# Patient Record
Sex: Male | Born: 1960
Health system: Southern US, Community
[De-identification: ages and names within clinical notes are randomized; demographics above are authoritative.]

## PROBLEM LIST (undated history)

## (undated) DIAGNOSIS — J449 Chronic obstructive pulmonary disease, unspecified: Secondary | ICD-10-CM

## (undated) DIAGNOSIS — J45909 Unspecified asthma, uncomplicated: Secondary | ICD-10-CM

## (undated) HISTORY — DX: Chronic obstructive pulmonary disease, unspecified: J44.9

## (undated) HISTORY — PX: EYE SURGERY: SHX253

## (undated) HISTORY — DX: Unspecified asthma, uncomplicated: J45.909

---

## 2013-06-20 ENCOUNTER — Ambulatory Visit: Payer: No Typology Code available for payment source | Attending: Internal Medicine | Admitting: Internal Medicine

## 2013-06-20 VITALS — BP 116/83 | HR 100 | Temp 98.7°F | Resp 16 | Wt 167.8 lb

## 2013-06-20 DIAGNOSIS — M549 Dorsalgia, unspecified: Secondary | ICD-10-CM

## 2013-06-20 DIAGNOSIS — J441 Chronic obstructive pulmonary disease with (acute) exacerbation: Secondary | ICD-10-CM | POA: Insufficient documentation

## 2013-06-20 DIAGNOSIS — F17200 Nicotine dependence, unspecified, uncomplicated: Secondary | ICD-10-CM

## 2013-06-20 MED ORDER — ALBUTEROL SULFATE (2.5 MG/3ML) 0.083% IN NEBU: 2.5000 mg | INHALATION_SOLUTION | Freq: Four times a day (QID) | RESPIRATORY_TRACT | Status: DC | PRN

## 2013-06-20 MED ORDER — PREDNISONE 10 MG PO TABS: ORAL_TABLET | ORAL | Status: DC

## 2013-06-20 MED ORDER — FLUTICASONE-SALMETEROL 100-50 MCG/DOSE IN AEPB: 1.0000 | INHALATION_SPRAY | Freq: Two times a day (BID) | RESPIRATORY_TRACT | Status: DC

## 2013-06-20 MED ORDER — METHYLPREDNISOLONE SODIUM SUCC 125 MG IJ SOLR
80.0000 mg | Freq: Once | INTRAMUSCULAR | Status: AC
  Administered 2013-06-20: 80 mg via INTRAMUSCULAR

## 2013-06-20 MED ORDER — IPRATROPIUM-ALBUTEROL 0.5-2.5 (3) MG/3ML IN SOLN
3.0000 mL | Freq: Once | RESPIRATORY_TRACT | Status: AC
  Administered 2013-06-20: 3 mL via RESPIRATORY_TRACT

## 2013-06-20 MED ORDER — AZITHROMYCIN 250 MG PO TABS: ORAL_TABLET | ORAL | Status: DC

## 2013-06-20 MED ORDER — ALBUTEROL SULFATE HFA 108 (90 BASE) MCG/ACT IN AERS: 2.0000 | INHALATION_SPRAY | RESPIRATORY_TRACT | Status: DC | PRN
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
## 2013-06-20 NOTE — Progress Notes (Signed)
Patient Demographics  Jordan Bradshaw, is a 52 y.o. male  WUJ:[id number]  NFA:[id number]  DOB - 11-15-1960  Chief Complaint  Patient presents with  . COPD        Subjective:   Jordan Bradshaw today is here to establish primary care. Patient is a 52 year old Caucasian male who has a long-standing history of COPD, ongoing tobacco use, chronic back pain who has not seen a physician for the past 3 years comes in for worsening shortness of breath and cough. Asian is originally from [place], has moved to this area approximately 2 years ago, claims that at baseline he has trouble with shortness of breath it is mostly exertional. He cannot go grocery shopping without stopping multiple times because of shortness of breath. He notes that for the past one year he thinks that this is gotten progressively worse. He has a very long history of smoking, now is down to approximately half a pack a day. He has a dry cough. He feels intermittently febrile.  He also has severe degenerative changes according to him in his back. Back pain is chronic and has been going on for past 3-4 years as well. He does not have any issues with urinary incontinence, fecal incontinence or any lower extremity weakness.  Currently patient has no complaints. Patient has also has No headache, No chest pain, No abdominal pain,No Nausea, No new weakness tingling or numbness  Objective:    Filed Vitals:   06/20/13 1140  BP: 116/83  Pulse: 100  Temp: 98.7 F (37.1 C)  Resp: 16  Weight: 167 lb 12.8 oz (76.114 kg)  SpO2: 98%     ALLERGIES:  No Known Allergies  PAST MEDICAL HISTORY: Past Medical History  Diagnosis Date  . Asthma   . COPD (chronic obstructive pulmonary disease)     PAST SURGICAL HISTORY: Past Surgical History  Procedure Laterality Date  . Eye surgery      FAMILY HISTORY: History reviewed. No pertinent family history.  MEDICATIONS AT HOME: Prior to Admission medications   Medication Sig Start  Date End Date Taking? Authorizing Provider  albuterol (PROVENTIL HFA;VENTOLIN HFA) 108 (90 BASE) MCG/ACT inhaler Inhale 2 puffs into the lungs every 4 (four) hours as needed for wheezing or shortness of breath. 06/20/13   Shanker Levora Dredge, MD  albuterol (PROVENTIL) (2.5 MG/3ML) 0.083% nebulizer solution Take 3 mLs (2.5 mg total) by nebulization every 6 (six) hours as needed for wheezing or shortness of breath. 06/20/13   Shanker Levora Dredge, MD  azithromycin (ZITHROMAX) 250 MG tablet Take 2 tablets daily for 3 days 06/20/13   Maretta Bees, MD  Fluticasone-Salmeterol (ADVAIR) 100-50 MCG/DOSE AEPB Inhale 1 puff into the lungs 2 (two) times daily. 06/20/13   Shanker Levora Dredge, MD  predniSONE (DELTASONE) 10 MG tablet Take 4 tablets (40 mg) daily for 2 days, then, Take 3 tablets (30 mg) daily for 2 days, then, Take 2 tablets (20 mg) daily for 2 days, then, Take1 tablets (10 mg) daily for 1 days, then stop 06/20/13   Maretta Bees, MD    SOCIAL HISTORY:   reports that he has been smoking Cigarettes.  He has been smoking about 0.00 packs per day. He does not have any smokeless tobacco history on file. He reports that he drinks alcohol. His drug history is not on file.  REVIEW OF SYSTEMS:  Constitutional:   No   Fevers, chills, fatigue.  HEENT:    No headaches, Sore throat,   Cardio-vascular: No chest  pain,  Orthopnea, swelling in lower extremities, anasarca, palpitations  GI:  No abdominal pain, nausea, vomiting, diarrhea  Resp:  No coughing up of blood  Skin:  no rash or lesions.  GU:  no dysuria, change in color of urine, no urgency or frequency.  No flank pain.  Musculoskeletal: No joint pain or swelling.  No decreased range of motion.  No back pain.  Psych: No change in mood or affect. No depression or anxiety.  No memory loss.   Exam  General appearance :Awake, alert, not in any distress. Speech Clear. Not toxic Looking HEENT: Atraumatic and Normocephalic, pupils  equally reactive to light and accomodation Neck: supple, no JVD. No cervical lymphadenopathy.  Chest:Good air entry bilaterally, coarse expiratory rhonchi heard.  CVS: S1 S2 regular, no murmurs.  Abdomen: Bowel sounds present, Non tender and not distended with no gaurding, rigidity or rebound. Extremities: B/L Lower Ext shows no edema, both legs are warm to touch Neurology: Awake alert, and oriented X 3, CN II-XII intact, Non focal Skin:No Rash Wounds:N/A    Data Review   CBC No results found for this basename: WBC, HGB, HCT, PLT, MCV, MCH, MCHC, RDW, NEUTRABS, LYMPHSABS, MONOABS, EOSABS, BASOSABS, BANDABS, BANDSABD,  in the last 168 hours  Chemistries   No results found for this basename: NA, K, CL, CO2, GLUCOSE, BUN, CREATININE, GFRCGP, CALCIUM, MG, AST, ALT, ALKPHOS, BILITOT,  in the last 168 hours ------------------------------------------------------------------------------------------------------------------ No results found for this basename: HGBA1C,  in the last 72 hours ------------------------------------------------------------------------------------------------------------------ No results found for this basename: CHOL, HDL, LDLCALC, TRIG, CHOLHDL, LDLDIRECT,  in the last 72 hours ------------------------------------------------------------------------------------------------------------------ No results found for this basename: TSH, T4TOTAL, FREET3, T3FREE, THYROIDAB,  in the last 72 hours ------------------------------------------------------------------------------------------------------------------ No results found for this basename: VITAMINB12, FOLATE, FERRITIN, TIBC, IRON, RETICCTPCT,  in the last 72 hours  Coagulation profile  No results found for this basename: INR, PROTIME,  in the last 168 hours    Assessment & Plan   COPD with mild exacerbation - Will give him on treatment with DuoNeb here in the office along with an intramuscular shot of Solu-Medrol -  Wouldn't put him on a prednisone taper and albuterol rescue inhaler. He has had urinary retention multiple times with Spiriva and would not like to be placed on Spiriva again, as a result I will place him on Advair. - We will see him in 2 weeks for a followup visit, if he were to have worsening of his symptoms, I recommended him to proceed to the emergency room.  Tobacco use - Counseled strongly to quit-I've explained that he needs to have the desire to quit-he has agreed to think about it and we will reevaluate all options next visit.  Chronic back pain - This is a chronic issue that has been going on for 3-4 years, we will start by getting an x-ray  I will order for routine blood work to be done a few days prior to his next appointment. Please follow his lab work at next visit.  Follow up in 2 weeks  The patient was given clear instructions to go to ER or return to medical center if symptoms don't improve, worsen or new problems develop. The patient verbalized understanding. The patient was told to call to get lab results if they haven't heard anything in the next week.

## 2013-06-20 NOTE — Addendum Note (Signed)
Addended by: Maretta Bees on: 06/20/2013 01:51 PM   Modules accepted: Orders

## 2013-06-26 ENCOUNTER — Encounter: Payer: Self-pay | Admitting: Internal Medicine

## 2013-06-26 ENCOUNTER — Ambulatory Visit (INDEPENDENT_AMBULATORY_CARE_PROVIDER_SITE_OTHER): Payer: No Typology Code available for payment source | Admitting: Internal Medicine

## 2013-06-26 VITALS — BP 102/70 | HR 98 | Temp 98.1°F | Ht 72.0 in | Wt 176.0 lb

## 2013-06-26 DIAGNOSIS — J449 Chronic obstructive pulmonary disease, unspecified: Secondary | ICD-10-CM

## 2013-06-26 DIAGNOSIS — F17200 Nicotine dependence, unspecified, uncomplicated: Secondary | ICD-10-CM

## 2013-06-26 MED ORDER — VARENICLINE TARTRATE 0.5 MG X 11 & 1 MG X 42 PO MISC: ORAL | Status: DC

## 2013-06-26 NOTE — Patient Instructions (Addendum)
Continue advair one twice daily perfectly regularly and taper off prednisone as planned  Only use your albuterol (ventolin) as a rescue medication to be used if you can't catch your breath by resting or doing a relaxed purse lip breathing pattern.  - The less you use it, the better it will work when you need it. - Ok to use up to every 4 hours if you must but call for immediate appointment if use goes up over your usual need - Don't leave home without it !!  (think of it like your spare tire for your car)   - Nebulizer is a backup for your ventolin, only use it after you first use your ventolin.   The key is to stop smoking completely before smoking completely stops you!   chantix starter pack use as directed   Please schedule a follow up office visit in 2 weeks, sooner if needed.with cxr on return and all inhalers in hand

## 2013-06-26 NOTE — Progress Notes (Signed)
  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 Better on advair, assoc with Heart burn.   y on advair 100 one bid. Still uses 3-4 saba hfa per day and also neb at bedtime. Some assoc HB but no def connection to flares of sob/cough   No obvious pattern in day to day or daytime variabilty or assoc  cp or chest tightness, subjective wheeze overt sinus   symptoms. No unusual exp hx or h/o childhood pna/ asthma or knowledge of premature birth.    Also denies any obvious fluctuation of symptoms with weather or environmental changes or other aggravating or alleviating factors except as outlined above   Current Medications, Allergies, Complete Past Medical History, Past Surgical History, Family History, and Social History were reviewed in [employer] record.         Review of Systems  Constitutional: Positive for appetite change. Negative for fever, chills, activity change and unexpected weight change.  HENT: Positive for dental problem. Negative for congestion, postnasal drip, rhinorrhea, sneezing, sore throat, trouble swallowing and voice change.   Eyes: Negative for visual disturbance.  Respiratory: Positive for cough and shortness of breath. Negative for choking.   Cardiovascular: Negative for chest pain and leg swelling.  Gastrointestinal: Negative for nausea, vomiting and abdominal pain.  Genitourinary: Negative for difficulty  urinating.       Acid Heartburn Indigestion  Musculoskeletal: Positive for arthralgias.  Skin: Positive for rash.  Psychiatric/Behavioral: Negative for behavioral problems and confusion.       Objective:   Physical Exam  Wt Readings from Last 3 Encounters:  06/26/13 176 lb (79.833 kg)  06/20/13 167 lb 12.8 oz (76.114 kg)     amb wm nad  HEENT mild turbinate edema.  Oropharynx no thrush or excess pnd or cobblestoning.  No JVD or cervical adenopathy. Mild accessory muscle hypertrophy. Trachea midline, nl thryroid. Chest was hyperinflated by percussion with diminished breath sounds and moderate increased exp time without wheeze. Hoover sign positive at mid inspiration. Regular rate and rhythm without murmur gallop or rub or increase P2 or edema.  Abd: no hsm, nl excursion. Ext warm without cyanosis or clubbing.         Assessment & Plan:

## 2013-06-28 DIAGNOSIS — J449 Chronic obstructive pulmonary disease, unspecified: Secondary | ICD-10-CM | POA: Insufficient documentation

## 2013-06-28 DIAGNOSIS — F17200 Nicotine dependence, unspecified, uncomplicated: Secondary | ICD-10-CM | POA: Insufficient documentation

## 2013-06-28 HISTORY — DX: Chronic obstructive pulmonary disease, unspecified: J44.9

## 2013-06-28 NOTE — Assessment & Plan Note (Signed)
He feels advair is already helping (though may be the prednisone taper) so would like to see him back for baseline spirometry at which point he should be off the advair  My main discussion related to smoking (see sep a/p) - without a commitment from him on this there is very little I can do for him as he has already proven intolerance to [facility] and has very limited resources for meds

## 2013-06-28 NOTE — Assessment & Plan Note (Signed)

## 2013-07-06 ENCOUNTER — Other Ambulatory Visit: Payer: Self-pay | Admitting: Internal Medicine

## 2013-07-06 MED ORDER — FLUTICASONE-SALMETEROL 100-50 MCG/DOSE IN AEPB: 1.0000 | INHALATION_SPRAY | Freq: Two times a day (BID) | RESPIRATORY_TRACT | Status: DC

## 2013-07-10 ENCOUNTER — Ambulatory Visit: Payer: Self-pay

## 2013-07-12 ENCOUNTER — Ambulatory Visit: Payer: No Typology Code available for payment source | Admitting: Internal Medicine

## 2013-07-24 ENCOUNTER — Ambulatory Visit (INDEPENDENT_AMBULATORY_CARE_PROVIDER_SITE_OTHER)
Admission: RE | Admit: 2013-07-24 | Discharge: 2013-07-24 | Disposition: A | Discharge status: Home / Self Care | Payer: No Typology Code available for payment source | Source: Ambulatory Visit | Attending: Internal Medicine | Admitting: Internal Medicine

## 2013-07-24 ENCOUNTER — Ambulatory Visit (INDEPENDENT_AMBULATORY_CARE_PROVIDER_SITE_OTHER): Payer: No Typology Code available for payment source | Admitting: Internal Medicine

## 2013-07-24 ENCOUNTER — Encounter: Payer: Self-pay | Admitting: Internal Medicine

## 2013-07-24 DIAGNOSIS — J449 Chronic obstructive pulmonary disease, unspecified: Secondary | ICD-10-CM

## 2013-07-24 NOTE — Progress Notes (Signed)
Quick Note:  Spoke with pt and notified of results per Dr. Wert. Pt verbalized understanding and denied any questions.  ______ 

## 2013-07-24 NOTE — Progress Notes (Signed)
  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 Better on advair, assoc with Heart burn.   y on advair 100 one bid. Still uses 3-4 saba hfa per day and also neb at bedtime. Some assoc HB but no def connection to flares of sob/cough  rec Continue advair one twice daily perfectly regularly and taper off prednisone as planned Only use your albuterol (ventolin)   Nebulizer is a backup for your ventolin, only use it after you first use your ventolin.  The key is to stop smoking completely before smoking completely stops you!  chantix starter pack use as directed   07/24/2013 f/u ov/Adit Riddles re: ? Copd Went for cxr and did not return        Objective:   Physical Exam  Wt Readings from Last 3 Encounters:  06/26/13 176 lb (79.833 kg)  06/20/13 167 lb 12.8 oz (76.114 kg)            CXR  07/24/2013 :   Mild hyperinflation is compatible with history of asthma. No focal chest disease.      Assessment & Plan:

## 2013-07-24 NOTE — Assessment & Plan Note (Signed)
Did not return p cxr > needs f/u ov with pfts

## 2013-07-24 NOTE — Patient Instructions (Addendum)
F/u with pfts

## 2013-07-27 ENCOUNTER — Other Ambulatory Visit: Payer: Self-pay | Admitting: Internal Medicine

## 2013-07-27 DIAGNOSIS — J449 Chronic obstructive pulmonary disease, unspecified: Secondary | ICD-10-CM

## 2013-07-31 ENCOUNTER — Encounter: Payer: Self-pay | Admitting: Internal Medicine

## 2013-07-31 ENCOUNTER — Ambulatory Visit: Payer: No Typology Code available for payment source | Attending: Internal Medicine | Admitting: Internal Medicine

## 2013-07-31 ENCOUNTER — Ambulatory Visit (INDEPENDENT_AMBULATORY_CARE_PROVIDER_SITE_OTHER): Payer: No Typology Code available for payment source | Admitting: Internal Medicine

## 2013-07-31 VITALS — BP 121/90 | HR 116 | Temp 98.8°F | Resp 14 | Ht 74.0 in | Wt 180.2 lb

## 2013-07-31 VITALS — BP 90/60 | HR 104 | Temp 98.0°F | Ht 72.5 in | Wt 180.0 lb

## 2013-07-31 DIAGNOSIS — J449 Chronic obstructive pulmonary disease, unspecified: Secondary | ICD-10-CM

## 2013-07-31 DIAGNOSIS — F17200 Nicotine dependence, unspecified, uncomplicated: Secondary | ICD-10-CM

## 2013-07-31 DIAGNOSIS — G8929 Other chronic pain: Secondary | ICD-10-CM

## 2013-07-31 LAB — PULMONARY FUNCTION TEST
DL/VA % pred: 80 %
DLCO unc % pred: 69 %
DLCO unc: 24.82 ml/min/mmHg
FEF 25-75 Post: 1.61 L/sec
FEF2575-%Pred-Pre: 42 %
FEV1-%Pred-Post: 70 %
FEV1-%Pred-Pre: 69 %
FEV1-Post: 2.97 L
FEV1-Pre: 2.94 L
FEV1FVC-%Change-Post: 0 %
FEV1FVC-%Pred-Pre: 78 %
FEV6-%Change-Post: 0 %
FEV6-%Pred-Post: 88 %
FEV6-%Pred-Pre: 88 %
FEV6-Post: 4.7 L
FEV6-Pre: 4.69 L
FEV6FVC-%Change-Post: 0 %
FEV6FVC-%Pred-Post: 101 %
FVC-%Change-Post: 0 %
FVC-%Pred-Post: 87 %
FVC-%Pred-Pre: 87 %
FVC-Post: 4.82 L
FVC-Pre: 4.78 L
Post FEV6/FVC ratio: 98 %
Pre FEV1/FVC ratio: 61 %
Pre FEV6/FVC Ratio: 98 %

## 2013-07-31 LAB — COMPREHENSIVE METABOLIC PANEL
ALT: 40 U/L (ref 0–53)
AST: 121 U/L — ABNORMAL HIGH (ref 0–37)
Albumin: 3.8 g/dL (ref 3.5–5.2)
Alkaline Phosphatase: 97 U/L (ref 39–117)
BUN: 3 mg/dL — ABNORMAL LOW (ref 6–23)
Glucose, Bld: 55 mg/dL — ABNORMAL LOW (ref 70–99)
Potassium: 4.1 mEq/L (ref 3.5–5.3)
Sodium: 140 mEq/L (ref 135–145)
Total Bilirubin: 0.8 mg/dL (ref 0.3–1.2)

## 2013-07-31 LAB — CBC WITH DIFFERENTIAL/PLATELET
Basophils Absolute: 0.1 10*3/uL (ref 0.0–0.1)
Basophils Relative: 1 % (ref 0–1)
Eosinophils Absolute: 0.1 10*3/uL (ref 0.0–0.7)
HCT: 42.4 % (ref 39.0–52.0)
MCH: 34.9 pg — ABNORMAL HIGH (ref 26.0–34.0)
MCHC: 35.6 g/dL (ref 30.0–36.0)
Monocytes Absolute: 0.6 10*3/uL (ref 0.1–1.0)
Neutro Abs: 1.8 10*3/uL (ref 1.7–7.7)
Neutrophils Relative %: 33 % — ABNORMAL LOW (ref 43–77)
RDW: 13.9 % (ref 11.5–15.5)
WBC: 5.4 10*3/uL (ref 4.0–10.5)

## 2013-07-31 MED ORDER — CYCLOBENZAPRINE HCL 10 MG PO TABS: 10.0000 mg | ORAL_TABLET | Freq: Three times a day (TID) | ORAL | Status: DC

## 2013-07-31 MED ORDER — MELOXICAM 15 MG PO TABS: 15.0000 mg | ORAL_TABLET | Freq: Every day | ORAL | Status: DC

## 2013-07-31 MED ORDER — TRAMADOL HCL 50 MG PO TABS: 50.0000 mg | ORAL_TABLET | Freq: Four times a day (QID) | ORAL | Status: DC | PRN

## 2013-07-31 MED ORDER — FLUTICASONE-SALMETEROL 250-50 MCG/DOSE IN AEPB: 1.0000 | INHALATION_SPRAY | Freq: Two times a day (BID) | RESPIRATORY_TRACT | Status: DC

## 2013-07-31 MED ORDER — TAMSULOSIN HCL 0.4 MG PO CAPS: 0.4000 mg | ORAL_CAPSULE | Freq: Every day | ORAL | Status: DC

## 2013-07-31 MED ORDER — PREDNISONE (PAK) 10 MG PO TABS: ORAL_TABLET | Freq: Every day | ORAL | Status: DC

## 2013-07-31 MED ORDER — IBUPROFEN 600 MG PO TABS: 600.0000 mg | ORAL_TABLET | Freq: Three times a day (TID) | ORAL | Status: DC | PRN

## 2013-07-31 NOTE — Progress Notes (Unsigned)
Patient ID: Jordan Bradshaw, male   DOB: 11-01-1960, 52 y.o.   MRN: [id number] Patient Demographics  Jordan Bradshaw, is a 52 y.o. male  WUJ:[id number]  NFA:[id number]  DOB - Sep 12, 1960  Chief Complaint  Patient presents with  . office visit        Subjective:   Jordan Bradshaw today is here for a follow up visit. The patient is a 52 year old male with history of COPD, following pulmonology, states he feels better with COPD, he also stopped taking his Advair states caused urinary retention. He also reports urinary retention, increased frequency, urgency, dribbling, worse at night. The patient also tearful and states he is miserable with his back pain, used to work as a Nutritional therapist. He states that he has been in back pain for last 3 years, his physician in [place] had obtained a CT scans of his spine 3 years ago and told him that he has disc disease. He also reports that he has neck pain and radiating to the right shoulder and right arm, low back pain radiating down to the right lower extremity.    Patient has No headache, No chest pain, No abdominal pain - No Nausea,   Objective:    Filed Vitals:   07/31/13 1146  BP: 121/90  Pulse: 116  Temp: 98.8 F (37.1 C)  TempSrc: Oral  Resp: 14  Height: 6\' 2"  (1.88 m)  Weight: 180 lb 3.2 oz (81.738 kg)  SpO2: 100%     ALLERGIES:  No Known Allergies  PAST MEDICAL HISTORY: Past Medical History  Diagnosis Date  . Asthma   . COPD (chronic obstructive pulmonary disease)     MEDICATIONS AT HOME: Prior to Admission medications   Medication Sig Start Date End Date Taking? Authorizing Provider  albuterol (PROVENTIL HFA;VENTOLIN HFA) 108 (90 BASE) MCG/ACT inhaler Inhale 2 puffs into the lungs every 4 (four) hours as needed for wheezing or shortness of breath. 06/20/13  Yes Shanker Levora Dredge, MD  albuterol (PROVENTIL) (2.5 MG/3ML) 0.083% nebulizer solution Take 3 mLs (2.5 mg total) by nebulization every 6 (six) hours as needed for wheezing or  shortness of breath. 06/20/13  Yes Shanker Levora Dredge, MD  cyclobenzaprine (FLEXERIL) 10 MG tablet Take 1 tablet (10 mg total) by mouth 3 (three) times daily. 07/31/13   Ripudeep Jenna Luo, MD  Fluticasone-Salmeterol (ADVAIR) 100-50 MCG/DOSE AEPB Inhale 1 puff into the lungs 2 (two) times daily. 07/06/13   Jeanann Lewandowsky, MD  ibuprofen (ADVIL,MOTRIN) 600 MG tablet Take 1 tablet (600 mg total) by mouth every 8 (eight) hours as needed for moderate pain. 07/31/13   Ripudeep Jenna Luo, MD  meloxicam (MOBIC) 15 MG tablet Take 1 tablet (15 mg total) by mouth daily. 07/31/13   Ripudeep Jenna Luo, MD  predniSONE (STERAPRED UNI-PAK) 10 MG tablet Take by mouth daily. Take as directed on the pack 07/31/13   Ripudeep K Rai, MD  tamsulosin (FLOMAX) 0.4 MG CAPS capsule Take 1 capsule (0.4 mg total) by mouth daily. 07/31/13   Ripudeep Jenna Luo, MD  traMADol (ULTRAM) 50 MG tablet Take 1 tablet (50 mg total) by mouth every 6 (six) hours as needed for moderate pain. 07/31/13   Ripudeep Jenna Luo, MD  varenicline (CHANTIX PAK) 0.5 MG X 11 & 1 MG X 42 tablet Take one 0.5 mg tablet by mouth once daily for 3 days, then increase to one 0.5 mg tablet twice daily for 4 days, then increase to one 1 mg tablet twice daily. 06/26/13   Charlaine Dalton  Sherene Sires, MD     Exam  General appearance :Awake, alert, NAD, Speech Clear.  HEENT: Atraumatic and Normocephalic, PERLA Neck: supple, no JVD. No cervical lymphadenopathy.  Chest: Clear to auscultation bilaterally, no wheezing, rales or rhonchi CVS: S1 S2 regular, no murmurs.  Abdomen: soft, NBS, NT, ND, no gaurding, rigidity or rebound. Extremities: no cyanosis or clubbing, B/L Lower Ext shows no edema Neurology: Awake alert, and oriented X 3, CN II-XII intact, Non focal Back : Patient did have cervical spine tenderness, T11-12, entire lumbar spine.   Skin: No Rash or lesions Wounds:N/A    Data Review   Basic Metabolic Panel: No results found for this basename: NA, K, CL, CO2, GLUCOSE, BUN,  CREATININE, CALCIUM, MG, PHOS,  in the last 168 hours Liver Function Tests: No results found for this basename: AST, ALT, ALKPHOS, BILITOT, PROT, ALBUMIN,  in the last 168 hours  CBC: No results found for this basename: WBC, NEUTROABS, HGB, HCT, MCV, PLT,  in the last 168 hours  ------------------------------------------------------------------------------------------------------------------ No results found for this basename: HGBA1C,  in the last 72 hours ------------------------------------------------------------------------------------------------------------------ No results found for this basename: CHOL, HDL, LDLCALC, TRIG, CHOLHDL, LDLDIRECT,  in the last 72 hours ------------------------------------------------------------------------------------------------------------------ No results found for this basename: TSH, T4TOTAL, FREET3, T3FREE, THYROIDAB,  in the last 72 hours ------------------------------------------------------------------------------------------------------------------ No results found for this basename: VITAMINB12, FOLATE, FERRITIN, TIBC, IRON, RETICCTPCT,  in the last 72 hours  Coagulation profile  No results found for this basename: INR, PROTIME,  in the last 168 hours    Assessment & Plan   Active Problems: Patient Active Problem List   Diagnosis Date Noted  . Chronic back pain, acute on chronic  -  obtain MRI of the cervical, thoracic, lumbar spine. Given patient has numbness and tingling and neuropathic symptoms, will expedite care by obtaining MRI, he probably will need to be seen by orthospine or neurosurgery soon. First await MRI results. - Started patient on Mobic 15 mg daily, Sterapred pack, Flexeril, and tramadol ibuprofen PRN - Will see within 4 weeks after MRI is done  07/31/2013  . COPD  - Currently stable, defer to pulmonology   06/28/2013  . Smoker - Patient counseled on smoking cessation  06/28/2013    Recommendations: Came baseline  labs, CBC, CMET, Vit D level Follow-up in 4 weeks     RAI,RIPUDEEP M.D. 07/31/2013, 12:09 PM

## 2013-07-31 NOTE — Progress Notes (Signed)
PFT done today. 

## 2013-07-31 NOTE — Patient Instructions (Signed)
Increase the advair to 250 one puff twice daily   The key is to stop smoking completely before smoking completely stops you - it's definitely not too late !

## 2013-07-31 NOTE — Progress Notes (Signed)
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 Better on advair, assoc with Heart burn.    on advair 100 one bid. Still uses 3-4 saba hfa per day and also neb at bedtime. Some assoc HB but no def connection to flares of sob/cough  rec Continue advair one twice daily perfectly regularly and taper off prednisone as planned Only use your albuterol (ventolin) prn   Nebulizer is a backup for your ventolin, only use it after you first use your ventolin.  The key is to stop smoking completely before smoking completely stops you!  chantix starter pack use as directed   07/24/2013 f/u ov/Jordan Bradshaw re: ? Copd Went for cxr and did not return  07/31/2013 f/u ov/Jordan Bradshaw re:  Chief Complaint  Patient presents with  . Followup with PFT    Pt states his breathing is doing better since last appt. No new co's todayt.   no using saba much at all/ never in neb form now, on advair 100/50 bid  No obvious day to day or daytime variabilty or assoc chronic cough or cp or chest tightness, subjective wheeze overt sinus or hb symptoms. No unusual exp hx or h/o childhood pna/ asthma or knowledge of premature birth.  Sleeping ok without nocturnal  or early am exacerbation  of respiratory  c/o's or need for noct saba. Also denies any obvious fluctuation of symptoms with weather or environmental changes or other aggravating or alleviating  factors except as outlined above   Current Medications, Allergies, Complete Past Medical History, Past Surgical History, Family History, and Social History were reviewed in [employer] record.  ROS  The following are not active complaints unless bolded sore throat, dysphagia, dental problems, itching, sneezing,  nasal congestion or excess/ purulent secretions, ear ache,   fever, chills, sweats, unintended wt loss, pleuritic or exertional cp, hemoptysis,  orthopnea pnd or leg swelling, presyncope, palpitations, heartburn, abdominal pain, anorexia, nausea, vomiting, diarrhea  or change in bowel or urinary habits, change in stools or urine, dysuria,hematuria,  rash, arthralgias, visual complaints, headache, numbness weakness or ataxia or problems with walking or coordination,  change in mood/affect or memory.              Objective:   Physical Exam Wt Readings from Last 3 Encounters:  07/31/13 180 lb (81.647 kg)  07/31/13 180 lb 3.2 oz (81.738 kg)  06/26/13 176 lb (79.833 kg)          Depressed / withdrawn/ sits slumped over and grunts some responses   HEENT mild turbinate edema.  Oropharynx no thrush or excess pnd or cobblestoning.  No JVD or cervical adenopathy. Mild accessory muscle hypertrophy. Trachea midline, nl thryroid. Chest was hyperinflated by percussion with diminished breath sounds and moderate increased exp time without wheeze. Hoover sign positive at mid inspiration. Regular rate  and rhythm without murmur gallop or rub or increase P2 or edema.  Abd: no hsm, nl excursion. Ext warm without cyanosis or clubbing.     CXR  07/24/2013 :   Mild hyperinflation is compatible with history of asthma. No focal chest disease.      Assessment & Plan:

## 2013-08-01 ENCOUNTER — Encounter: Payer: Self-pay | Admitting: Internal Medicine

## 2013-08-01 NOTE — Assessment & Plan Note (Signed)
See copd/ key is to stop smoking now while FEV1 is still well preserved

## 2013-08-01 NOTE — Assessment & Plan Note (Addendum)
-   hfa 75% 06/26/13  - PFTs 07/31/2013  FEV1 2.94 (69%) ratio 61 and no better p B2   DLCO 69 corrects to 80  I had an extended discussion with the patient today lasting 15 to 20 minutes of a 25 minute visit on the following issues:  I reviewed the Flethcher curve with patient that basically indicates  if you quit smoking when your best day FEV1 is still well preserved it is highly unlikely you will progress to severe disease and informed the patient there was no medication on the market that has proven to change the curve or the likelihood of progression.  Therefore stopping smoking and maintaining abstinence is the most important aspect of care, not choice of inhalers or for that matter, doctors.    Best option is advair 250/50 one bid and prn saba  Pulmonary f/u can be prn

## 2013-08-03 ENCOUNTER — Ambulatory Visit (HOSPITAL_COMMUNITY)
Admission: RE | Admit: 2013-08-03 | Discharge: 2013-08-03 | Disposition: A | Discharge status: Home / Self Care | Payer: No Typology Code available for payment source | Source: Ambulatory Visit | Attending: Internal Medicine | Admitting: Internal Medicine

## 2013-08-03 DIAGNOSIS — M8448XA Pathological fracture, other site, initial encounter for fracture: Secondary | ICD-10-CM | POA: Insufficient documentation

## 2013-08-03 DIAGNOSIS — M48061 Spinal stenosis, lumbar region without neurogenic claudication: Secondary | ICD-10-CM | POA: Insufficient documentation

## 2013-08-03 DIAGNOSIS — M5144 Schmorl's nodes, thoracic region: Secondary | ICD-10-CM | POA: Insufficient documentation

## 2013-08-03 DIAGNOSIS — M47814 Spondylosis without myelopathy or radiculopathy, thoracic region: Secondary | ICD-10-CM | POA: Insufficient documentation

## 2013-08-03 DIAGNOSIS — G8929 Other chronic pain: Secondary | ICD-10-CM | POA: Insufficient documentation

## 2013-08-03 DIAGNOSIS — M47812 Spondylosis without myelopathy or radiculopathy, cervical region: Secondary | ICD-10-CM | POA: Insufficient documentation

## 2013-08-03 DIAGNOSIS — M5030 Other cervical disc degeneration, unspecified cervical region: Secondary | ICD-10-CM | POA: Insufficient documentation

## 2013-08-31 ENCOUNTER — Ambulatory Visit: Payer: No Typology Code available for payment source | Attending: Internal Medicine | Admitting: Internal Medicine

## 2013-08-31 ENCOUNTER — Encounter: Payer: Self-pay | Admitting: Internal Medicine

## 2013-08-31 VITALS — BP 146/86 | HR 80 | Temp 98.2°F | Resp 15 | Wt 185.0 lb

## 2013-08-31 DIAGNOSIS — M4802 Spinal stenosis, cervical region: Secondary | ICD-10-CM

## 2013-08-31 DIAGNOSIS — M549 Dorsalgia, unspecified: Secondary | ICD-10-CM

## 2013-08-31 DIAGNOSIS — G8929 Other chronic pain: Secondary | ICD-10-CM

## 2013-08-31 DIAGNOSIS — M48061 Spinal stenosis, lumbar region without neurogenic claudication: Secondary | ICD-10-CM

## 2013-08-31 MED ORDER — TRAMADOL HCL 50 MG PO TABS: 50.0000 mg | ORAL_TABLET | Freq: Four times a day (QID) | ORAL | Status: DC | PRN

## 2013-08-31 NOTE — Progress Notes (Signed)
Patient here for follow up on his back pain Has history of slipped disk and degenerative disk disease

## 2013-08-31 NOTE — Progress Notes (Signed)
MRN: [id number] Name: Jordan Bradshaw  Sex: male Age: 53 y.o. DOB: Dec 29, 1960  Allergies: Review of patient's allergies indicates no known allergies.  Chief Complaint  Patient presents with  . Back Pain    HPI: Patient is 53 y.o. male who comes today for followup her history of chronic low back pain neck pain, was seen last month by Dr. Isidoro Donning and had a CAT scan spine done which reported to havecMild to moderate right foraminal stenosis at C2-3, C3-4, and  C4-5.  Mild central canal stenosis at C5-6 is worse on the right due to  a disc osteophyte complex. And lower lumbar spine CT scan reported Moderate to severe right foraminal stenosis due to asymmetric  right-sided endplate and facet changes. As per patient he has this pain for the last 3 years denies any fall or trauma the pain is worse on the right radial down to the leg denies any incontinence, patient is taking pain medication and is requesting refill on the medication. Patient also history of COPD following up with pulmonologist and is using his inhalers.    Past Medical History  Diagnosis Date  . Asthma   . COPD (chronic obstructive pulmonary disease)     Past Surgical History  Procedure Laterality Date  . Eye surgery        Medication List       This list is accurate as of: 08/31/13 11:25 AM.  Always use your most recent med list.               albuterol (2.5 MG/3ML) 0.083% nebulizer solution  Commonly known as:  PROVENTIL  Take 3 mLs (2.5 mg total) by nebulization every 6 (six) hours as needed for wheezing or shortness of breath.     albuterol 108 (90 BASE) MCG/ACT inhaler  Commonly known as:  PROVENTIL HFA;VENTOLIN HFA  Inhale 2 puffs into the lungs every 4 (four) hours as needed for wheezing or shortness of breath.     cyclobenzaprine 10 MG tablet  Commonly known as:  FLEXERIL  Take 1 tablet (10 mg total) by mouth 3 (three) times daily.     Fluticasone-Salmeterol 250-50 MCG/DOSE Aepb  Commonly known as:   ADVAIR  Inhale 1 puff into the lungs every 12 (twelve) hours.     ibuprofen 600 MG tablet  Commonly known as:  ADVIL,MOTRIN  Take 1 tablet (600 mg total) by mouth every 8 (eight) hours as needed for moderate pain.     meloxicam 15 MG tablet  Commonly known as:  MOBIC  Take 1 tablet (15 mg total) by mouth daily.     tamsulosin 0.4 MG Caps capsule  Commonly known as:  FLOMAX  Take 1 capsule (0.4 mg total) by mouth daily.     traMADol 50 MG tablet  Commonly known as:  ULTRAM  Take 1 tablet (50 mg total) by mouth every 6 (six) hours as needed for moderate pain.        Meds ordered this encounter  Medications  . traMADol (ULTRAM) 50 MG tablet    Sig: Take 1 tablet (50 mg total) by mouth every 6 (six) hours as needed for moderate pain.    Dispense:  60 tablet    Refill:  1     There is no immunization history on file for this patient.  Family History  Problem Relation Age of Onset  . Lung cancer Sister     smoked  . Ovarian cancer Mother     History  Substance Use Topics  . Smoking status: Current Some Day Smoker -- 0.50 packs/day for 35 years    Types: Cigarettes  . Smokeless tobacco: Not on file  . Alcohol Use: No    Review of Systems  As noted in HPI  Filed Vitals:   08/31/13 1050  BP: 146/86  Pulse: 80  Temp: 98.2 F (36.8 C)  Resp: 15    Physical Exam  Physical Exam  Constitutional:  Patient is complaining of lower back pain  Eyes: EOM are normal. Pupils are equal, round, and reactive to light.  Cardiovascular: Normal rate and regular rhythm.   Pulmonary/Chest: He has no rales.  Minimal wheezing  Musculoskeletal:  Upper cervical spine and lower cervical spine and paraspinal tenderness, patient could not do SLR test because of pain, DTR 1+    CBC    Component Value Date/Time   WBC 5.4 07/31/2013 1229   RBC 4.33 07/31/2013 1229   HGB 15.1 07/31/2013 1229   HCT 42.4 07/31/2013 1229   PLT 250 07/31/2013 1229   MCV 97.9 07/31/2013 1229    LYMPHSABS 2.8 07/31/2013 1229   MONOABS 0.6 07/31/2013 1229   EOSABS 0.1 07/31/2013 1229   BASOSABS 0.1 07/31/2013 1229    CMP     Component Value Date/Time   NA 140 07/31/2013 1229   K 4.1 07/31/2013 1229   CL 107 07/31/2013 1229   CO2 25 07/31/2013 1229   GLUCOSE 55* 07/31/2013 1229   BUN 3* 07/31/2013 1229   CREATININE 0.78 07/31/2013 1229   CALCIUM 9.3 07/31/2013 1229   PROT 6.5 07/31/2013 1229   ALBUMIN 3.8 07/31/2013 1229   AST 121* 07/31/2013 1229   ALT 40 07/31/2013 1229   ALKPHOS 97 07/31/2013 1229   BILITOT 0.8 07/31/2013 1229    No results found for this basename: chol, tri, ldl    No components found with this basename: hga1c    Lab Results  Component Value Date/Time   AST 121* 07/31/2013 12:29 PM    Assessment and Plan  Chronic back pain - Plan: Ambulatory referral to Neurosurgery, traMADol (ULTRAM) 50 MG tablet  Foraminal stenosis of lumbar region - Plan: Ambulatory referral to Neurosurgery  Cervical stenosis of spinal canal - Plan: Ambulatory referral to Neurosurgery  I have continued and given refill on the pain medication tramadol, patient referred to neurosurgery for further evaluation and management. Patient to continue follow with his pulmonologist.  Return in about 3 months (around 11/29/2013).  Doris CheadleADVANI, Malory Spurr, MD  80

## 2013-09-13 ENCOUNTER — Other Ambulatory Visit: Payer: Self-pay | Admitting: Internal Medicine

## 2013-09-13 MED ORDER — FLUTICASONE-SALMETEROL 250-50 MCG/DOSE IN AEPB: 1.0000 | INHALATION_SPRAY | Freq: Two times a day (BID) | RESPIRATORY_TRACT | Status: DC

## 2013-09-13 MED ORDER — ALBUTEROL SULFATE HFA 108 (90 BASE) MCG/ACT IN AERS: 2.0000 | INHALATION_SPRAY | RESPIRATORY_TRACT | Status: DC | PRN

## 2013-09-21 ENCOUNTER — Ambulatory Visit: Payer: No Typology Code available for payment source

## 2013-10-10 ENCOUNTER — Ambulatory Visit: Payer: No Typology Code available for payment source

## 2013-10-24 ENCOUNTER — Other Ambulatory Visit: Payer: Self-pay | Admitting: Emergency Medicine

## 2013-10-24 MED ORDER — ALBUTEROL SULFATE HFA 108 (90 BASE) MCG/ACT IN AERS: 2.0000 | INHALATION_SPRAY | RESPIRATORY_TRACT | Status: DC | PRN

## 2013-10-27 ENCOUNTER — Ambulatory Visit: Payer: No Typology Code available for payment source | Attending: Internal Medicine | Admitting: Internal Medicine

## 2013-10-27 VITALS — BP 128/86 | HR 100 | Temp 97.7°F | Resp 18 | Ht 72.0 in | Wt 194.0 lb

## 2013-10-27 DIAGNOSIS — M48061 Spinal stenosis, lumbar region without neurogenic claudication: Secondary | ICD-10-CM | POA: Insufficient documentation

## 2013-10-27 DIAGNOSIS — M4802 Spinal stenosis, cervical region: Secondary | ICD-10-CM | POA: Insufficient documentation

## 2013-10-27 DIAGNOSIS — J449 Chronic obstructive pulmonary disease, unspecified: Secondary | ICD-10-CM | POA: Insufficient documentation

## 2013-10-27 DIAGNOSIS — F17200 Nicotine dependence, unspecified, uncomplicated: Secondary | ICD-10-CM | POA: Insufficient documentation

## 2013-10-27 DIAGNOSIS — M549 Dorsalgia, unspecified: Secondary | ICD-10-CM

## 2013-10-27 DIAGNOSIS — G8929 Other chronic pain: Secondary | ICD-10-CM

## 2013-10-27 DIAGNOSIS — Z09 Encounter for follow-up examination after completed treatment for conditions other than malignant neoplasm: Secondary | ICD-10-CM | POA: Insufficient documentation

## 2013-10-27 DIAGNOSIS — J4489 Other specified chronic obstructive pulmonary disease: Secondary | ICD-10-CM | POA: Insufficient documentation

## 2013-10-27 MED ORDER — CYCLOBENZAPRINE HCL 10 MG PO TABS: 10.0000 mg | ORAL_TABLET | Freq: Three times a day (TID) | ORAL | Status: DC

## 2013-10-27 MED ORDER — IBUPROFEN 600 MG PO TABS: 600.0000 mg | ORAL_TABLET | Freq: Three times a day (TID) | ORAL | Status: DC | PRN

## 2013-10-27 MED ORDER — TAMSULOSIN HCL 0.4 MG PO CAPS: 0.4000 mg | ORAL_CAPSULE | Freq: Every day | ORAL | Status: DC

## 2013-10-27 MED ORDER — TRAMADOL HCL 50 MG PO TABS: 50.0000 mg | ORAL_TABLET | Freq: Four times a day (QID) | ORAL | Status: DC | PRN

## 2013-10-27 NOTE — Progress Notes (Signed)
Pt here to f/u imaging and pain medication refill for chronic pain Pt need new back specialist referral. States his family is willing to pay copay

## 2013-10-27 NOTE — Progress Notes (Signed)
MRN: [id number] Name: Jordan Bradshaw  Sex: male Age: 53 y.o. DOB: 21-Jul-1961  Allergies: Review of patient's allergies indicates no known allergies.  Chief Complaint  Patient presents with  . Follow-up  . Back Pain  . COPD    HPI: Patient is 53 y.o. male who has history of chronic back pain neck pain, history of COPD following up with pulmonologist, patient had already been referred to neurosurgery, could not make an appointment, is requesting another referral and is requesting refill on pain medication. Denies any new symptoms.  Past Medical History  Diagnosis Date  . Asthma   . COPD (chronic obstructive pulmonary disease)     Past Surgical History  Procedure Laterality Date  . Eye surgery        Medication List       This list is accurate as of: 10/27/13  3:41 PM.  Always use your most recent med list.               albuterol (2.5 MG/3ML) 0.083% nebulizer solution  Commonly known as:  PROVENTIL  Take 3 mLs (2.5 mg total) by nebulization every 6 (six) hours as needed for wheezing or shortness of breath.     albuterol 108 (90 BASE) MCG/ACT inhaler  Commonly known as:  PROVENTIL HFA;VENTOLIN HFA  Inhale 2 puffs into the lungs every 4 (four) hours as needed for wheezing or shortness of breath.     cyclobenzaprine 10 MG tablet  Commonly known as:  FLEXERIL  Take 1 tablet (10 mg total) by mouth 3 (three) times daily.     Fluticasone-Salmeterol 250-50 MCG/DOSE Aepb  Commonly known as:  ADVAIR  Inhale 1 puff into the lungs every 12 (twelve) hours.     ibuprofen 600 MG tablet  Commonly known as:  ADVIL,MOTRIN  Take 1 tablet (600 mg total) by mouth every 8 (eight) hours as needed for moderate pain.     meloxicam 15 MG tablet  Commonly known as:  MOBIC  Take 1 tablet (15 mg total) by mouth daily.     tamsulosin 0.4 MG Caps capsule  Commonly known as:  FLOMAX  Take 1 capsule (0.4 mg total) by mouth daily.     traMADol 50 MG tablet  Commonly known as:   ULTRAM  Take 1 tablet (50 mg total) by mouth every 6 (six) hours as needed for moderate pain.        Meds ordered this encounter  Medications  . tamsulosin (FLOMAX) 0.4 MG CAPS capsule    Sig: Take 1 capsule (0.4 mg total) by mouth daily.    Dispense:  30 capsule    Refill:  3  . ibuprofen (ADVIL,MOTRIN) 600 MG tablet    Sig: Take 1 tablet (600 mg total) by mouth every 8 (eight) hours as needed for moderate pain.    Dispense:  30 tablet    Refill:  3  . cyclobenzaprine (FLEXERIL) 10 MG tablet    Sig: Take 1 tablet (10 mg total) by mouth 3 (three) times daily.    Dispense:  90 tablet    Refill:  3  . traMADol (ULTRAM) 50 MG tablet    Sig: Take 1 tablet (50 mg total) by mouth every 6 (six) hours as needed for moderate pain.    Dispense:  60 tablet    Refill:  1     There is no immunization history on file for this patient.  Family History  Problem Relation Age of Onset  .  Lung cancer Sister     smoked  . Ovarian cancer Mother     History  Substance Use Topics  . Smoking status: Current Some Day Smoker -- 0.50 packs/day for 35 years    Types: Cigarettes  . Smokeless tobacco: Not on file  . Alcohol Use: No    Review of Systems   As noted in HPI  Filed Vitals:   10/27/13 1529  BP: 128/86  Pulse: 100  Temp: 97.7 F (36.5 C)  Resp: 18    Physical Exam  Physical Exam  Constitutional: No distress.  Eyes: EOM are normal. Pupils are equal, round, and reactive to light.  Cardiovascular: Normal rate and regular rhythm.   Pulmonary/Chest: Breath sounds normal. No respiratory distress. He has no wheezes. He has no rales.  Musculoskeletal:  Upper cervical spine and lower back spine and paraspinal tenderness, patient  Again could not do SLR test because of pain, DTR 1+        CBC    Component Value Date/Time   WBC 5.4 07/31/2013 1229   RBC 4.33 07/31/2013 1229   HGB 15.1 07/31/2013 1229   HCT 42.4 07/31/2013 1229   PLT 250 07/31/2013 1229   MCV 97.9  07/31/2013 1229   LYMPHSABS 2.8 07/31/2013 1229   MONOABS 0.6 07/31/2013 1229   EOSABS 0.1 07/31/2013 1229   BASOSABS 0.1 07/31/2013 1229    CMP     Component Value Date/Time   NA 140 07/31/2013 1229   K 4.1 07/31/2013 1229   CL 107 07/31/2013 1229   CO2 25 07/31/2013 1229   GLUCOSE 55* 07/31/2013 1229   BUN 3* 07/31/2013 1229   CREATININE 0.78 07/31/2013 1229   CALCIUM 9.3 07/31/2013 1229   PROT 6.5 07/31/2013 1229   ALBUMIN 3.8 07/31/2013 1229   AST 121* 07/31/2013 1229   ALT 40 07/31/2013 1229   ALKPHOS 97 07/31/2013 1229   BILITOT 0.8 07/31/2013 1229    No results found for this basename: chol, tri, ldl    No components found with this basename: hga1c    Lab Results  Component Value Date/Time   AST 121* 07/31/2013 12:29 PM    Assessment and Plan  Chronic back pain - Plan: Ambulatory referral to Neurosurgery, ibuprofen (ADVIL,MOTRIN) 600 MG tablet, cyclobenzaprine (FLEXERIL) 10 MG tablet, traMADol (ULTRAM) 50 MG tablet  Cervical stenosis of spinal canal - Plan: Ambulatory referral to Neurosurgery  Foraminal stenosis of lumbar region - Plan: Ambulatory referral to Neurosurgery   Return if symptoms worsen or fail to improve.  Doris CheadleADVANI, Dakwan Pridgen, MD

## 2013-11-14 ENCOUNTER — Other Ambulatory Visit: Payer: Self-pay

## 2013-11-14 NOTE — Progress Notes (Unsigned)
Patient dropped off paper  Work for disability parking placard Filled out form and doctor signed off Copy made and sent to be scanned into chart

## 2013-11-22 ENCOUNTER — Other Ambulatory Visit: Payer: Self-pay | Admitting: Neurosurgery

## 2013-11-22 DIAGNOSIS — M542 Cervicalgia: Secondary | ICD-10-CM

## 2013-11-22 DIAGNOSIS — M549 Dorsalgia, unspecified: Secondary | ICD-10-CM

## 2013-11-24 ENCOUNTER — Other Ambulatory Visit: Payer: Self-pay | Admitting: Internal Medicine

## 2013-11-29 ENCOUNTER — Ambulatory Visit: Payer: No Typology Code available for payment source | Attending: Internal Medicine | Admitting: Internal Medicine

## 2013-11-29 ENCOUNTER — Encounter: Payer: Self-pay | Admitting: Internal Medicine

## 2013-11-29 VITALS — BP 112/79 | HR 104 | Temp 98.0°F | Resp 14 | Ht 72.0 in | Wt 199.0 lb

## 2013-11-29 DIAGNOSIS — J441 Chronic obstructive pulmonary disease with (acute) exacerbation: Secondary | ICD-10-CM

## 2013-11-29 DIAGNOSIS — R21 Rash and other nonspecific skin eruption: Secondary | ICD-10-CM | POA: Insufficient documentation

## 2013-11-29 DIAGNOSIS — Z87891 Personal history of nicotine dependence: Secondary | ICD-10-CM | POA: Insufficient documentation

## 2013-11-29 DIAGNOSIS — G8929 Other chronic pain: Secondary | ICD-10-CM | POA: Insufficient documentation

## 2013-11-29 DIAGNOSIS — Z791 Long term (current) use of non-steroidal anti-inflammatories (NSAID): Secondary | ICD-10-CM | POA: Insufficient documentation

## 2013-11-29 DIAGNOSIS — J4489 Other specified chronic obstructive pulmonary disease: Secondary | ICD-10-CM | POA: Insufficient documentation

## 2013-11-29 DIAGNOSIS — J449 Chronic obstructive pulmonary disease, unspecified: Secondary | ICD-10-CM | POA: Insufficient documentation

## 2013-11-29 DIAGNOSIS — Z79899 Other long term (current) drug therapy: Secondary | ICD-10-CM | POA: Insufficient documentation

## 2013-11-29 DIAGNOSIS — M549 Dorsalgia, unspecified: Secondary | ICD-10-CM | POA: Insufficient documentation

## 2013-11-29 MED ORDER — METHYLPREDNISOLONE 4 MG PO KIT: PACK | ORAL | Status: DC

## 2013-11-29 MED ORDER — CYCLOBENZAPRINE HCL 10 MG PO TABS
10.0000 mg | ORAL_TABLET | Freq: Three times a day (TID) | ORAL | Status: DC
Start: 2013-11-29 — End: 2014-03-05

## 2013-11-29 NOTE — Progress Notes (Signed)
Pt is here following up on his chronic back pain and COPD. Pt reports having a rash on his abdomen. Pt states that he might have a bone spur on his left foot.

## 2013-11-29 NOTE — Progress Notes (Signed)
Mr Jordan Bradshaw picked up his prescription for tramadol at his office visit

## 2013-11-29 NOTE — Progress Notes (Signed)
MRN: [id number] Name: Jordan Bradshaw  Sex: male Age: 53 y.o. DOB: 11/19/1960  Allergies: Review of patient's allergies indicates no known allergies.  Chief Complaint  Patient presents with  . Follow-up    HPI: Patient is 53 y.o. male who has history of chronic back pain currently following up with the neurosurgeon, patient requesting refill on his pain medication tramadol and Flexeril, also history of COPD and using Advair, denies any acute exacerbation, patient reported to have noticed rash on his abdomen denies any change in soap detergent medications, denies any other family member having symptoms.  Past Medical History  Diagnosis Date  . Asthma   . COPD (chronic obstructive pulmonary disease)     Past Surgical History  Procedure Laterality Date  . Eye surgery        Medication List       This list is accurate as of: 11/29/13  4:48 PM.  Always use your most recent med list.               albuterol (2.5 MG/3ML) 0.083% nebulizer solution  Commonly known as:  PROVENTIL  Take 3 mLs (2.5 mg total) by nebulization every 6 (six) hours as needed for wheezing or shortness of breath.     albuterol 108 (90 BASE) MCG/ACT inhaler  Commonly known as:  PROVENTIL HFA;VENTOLIN HFA  Inhale 2 puffs into the lungs every 4 (four) hours as needed for wheezing or shortness of breath.     cyclobenzaprine 10 MG tablet  Commonly known as:  FLEXERIL  Take 1 tablet (10 mg total) by mouth 3 (three) times daily.     Fluticasone-Salmeterol 250-50 MCG/DOSE Aepb  Commonly known as:  ADVAIR  Inhale 1 puff into the lungs every 12 (twelve) hours.     ibuprofen 600 MG tablet  Commonly known as:  ADVIL,MOTRIN  Take 1 tablet (600 mg total) by mouth every 8 (eight) hours as needed for moderate pain.     meloxicam 15 MG tablet  Commonly known as:  MOBIC  Take 1 tablet (15 mg total) by mouth daily.     methylPREDNISolone 4 MG tablet  Commonly known as:  MEDROL DOSEPAK  follow package  directions     tamsulosin 0.4 MG Caps capsule  Commonly known as:  FLOMAX  Take 1 capsule (0.4 mg total) by mouth daily.     traMADol 50 MG tablet  Commonly known as:  ULTRAM  TAKE 1 TABLET BY MOUTH EVERY 6 HOURS AS NEEDED FOR MODERATE PAIN        Meds ordered this encounter  Medications  . methylPREDNISolone (MEDROL DOSEPAK) 4 MG tablet    Sig: follow package directions    Dispense:  21 tablet    Refill:  0  . cyclobenzaprine (FLEXERIL) 10 MG tablet    Sig: Take 1 tablet (10 mg total) by mouth 3 (three) times daily.    Dispense:  90 tablet    Refill:  3     There is no immunization history on file for this patient.  Family History  Problem Relation Age of Onset  . Lung cancer Sister     smoked  . Ovarian cancer Mother     History  Substance Use Topics  . Smoking status: Former Smoker -- 0.50 packs/day for 35 years    Types: Cigarettes  . Smokeless tobacco: Former NeurosurgeonUser    Quit date: 08/31/2013  . Alcohol Use: No    Review of Systems   As noted  in HPI  Filed Vitals:   11/29/13 1619  BP: 112/79  Pulse: 104  Temp: 98 F (36.7 C)  Resp: 14    Physical Exam  Physical Exam  Constitutional: No distress.  Eyes: EOM are normal. Pupils are equal, round, and reactive to light.  Cardiovascular: Normal rate and regular rhythm.   Musculoskeletal:  Cervical  spine and lower back spine and paraspinal tenderness   Skin:  Rash on abdomen     CBC    Component Value Date/Time   WBC 5.4 07/31/2013 1229   RBC 4.33 07/31/2013 1229   HGB 15.1 07/31/2013 1229   HCT 42.4 07/31/2013 1229   PLT 250 07/31/2013 1229   MCV 97.9 07/31/2013 1229   LYMPHSABS 2.8 07/31/2013 1229   MONOABS 0.6 07/31/2013 1229   EOSABS 0.1 07/31/2013 1229   BASOSABS 0.1 07/31/2013 1229    CMP     Component Value Date/Time   NA 140 07/31/2013 1229   K 4.1 07/31/2013 1229   CL 107 07/31/2013 1229   CO2 25 07/31/2013 1229   GLUCOSE 55* 07/31/2013 1229   BUN 3* 07/31/2013 1229    CREATININE 0.78 07/31/2013 1229   CALCIUM 9.3 07/31/2013 1229   PROT 6.5 07/31/2013 1229   ALBUMIN 3.8 07/31/2013 1229   AST 121* 07/31/2013 1229   ALT 40 07/31/2013 1229   ALKPHOS 97 07/31/2013 1229   BILITOT 0.8 07/31/2013 1229    No results found for this basename: chol, tri, ldl    No components found with this basename: hga1c    Lab Results  Component Value Date/Time   AST 121* 07/31/2013 12:29 PM    Assessment and Plan  Chronic back pain - Plan: cyclobenzaprine (FLEXERIL) 10 MG tablet, advise for heating pad.  COPD  Continue with Advair and albuterol when necessary.  Rash and nonspecific skin eruption - Plan: methylPREDNISolone (MEDROL DOSEPAK) 4 MG tablet    Return in about 4 months (around 03/31/2014), or if symptoms worsen or fail to improve, for back pain.  Doris Cheadleeepak [place], MD

## 2013-12-12 ENCOUNTER — Ambulatory Visit
Admission: RE | Admit: 2013-12-12 | Discharge: 2013-12-12 | Disposition: A | Discharge status: Home / Self Care | Payer: No Typology Code available for payment source | Source: Ambulatory Visit | Attending: Neurosurgery | Admitting: Neurosurgery

## 2013-12-12 VITALS — BP 119/78 | HR 80

## 2013-12-12 DIAGNOSIS — M542 Cervicalgia: Secondary | ICD-10-CM

## 2013-12-12 DIAGNOSIS — M549 Dorsalgia, unspecified: Secondary | ICD-10-CM

## 2013-12-12 DIAGNOSIS — G8929 Other chronic pain: Secondary | ICD-10-CM

## 2013-12-12 MED ORDER — IOHEXOL 300 MG/ML  SOLN
10.0000 mL | Freq: Once | INTRAMUSCULAR | Status: AC | PRN
  Administered 2013-12-12: 10 mL via INTRATHECAL

## 2013-12-12 MED ORDER — ONDANSETRON HCL 4 MG/2ML IJ SOLN
4.0000 mg | Freq: Once | INTRAMUSCULAR | Status: AC
  Administered 2013-12-12: 4 mg via INTRAMUSCULAR

## 2013-12-12 MED ORDER — DIAZEPAM 5 MG PO TABS
5.0000 mg | ORAL_TABLET | Freq: Once | ORAL | Status: AC
  Administered 2013-12-12: 5 mg via ORAL

## 2013-12-12 MED ORDER — MEPERIDINE HCL 100 MG/ML IJ SOLN
100.0000 mg | Freq: Once | INTRAMUSCULAR | Status: AC
  Administered 2013-12-12: 100 mg via INTRAMUSCULAR

## 2013-12-12 NOTE — Discharge Instructions (Signed)
Myelogram Discharge Instructions  1. Go home and rest quietly for the next 24 hours.  It is important to lie flat for the next 24 hours.  Get up only to go to the restroom.  You may lie in the bed or on a couch on your back, your stomach, your left side or your right side.  You may have one pillow under your head.  You may have pillows between your knees while you are on your side or under your knees while you are on your back.  2. DO NOT drive today.  Recline the seat as far back as it will go, while still wearing your seat belt, on the way home.  3. You may get up to go to the bathroom as needed.  You may sit up for 10 minutes to eat.  You may resume your normal diet and medications unless otherwise indicated.  Drink lots of extra fluids today and tomorrow.  4. The incidence of headache, nausea, or vomiting is about 5% (one in 20 patients).  If you develop a headache, lie flat and drink plenty of fluids until the headache goes away.  Caffeinated beverages may be helpful.  If you develop severe nausea and vomiting or a headache that does not go away with flat bed rest, call [phone].  5. You may resume normal activities after your 24 hours of bed rest is over; however, do not exert yourself strongly or do any heavy lifting tomorrow. If when you get up you have a headache when standing, go back to bed and force fluids for another 24 hours.  6. Call your physician for a follow-up appointment.  The results of your myelogram will be sent directly to your physician by the following day.  7. If you have any questions or if complications develop after you arrive home, please call [phone].  Discharge instructions have been explained to the patient.  The patient, or the person responsible for the patient, fully understands these instructions.      May resume Tramadol on December 13, 2013, after 11:00 am.

## 2013-12-12 NOTE — Progress Notes (Signed)
Pt states he has been off tramadol for the past 2 days. Discharge instructions explained to pt. 

## 2013-12-25 ENCOUNTER — Other Ambulatory Visit: Payer: Self-pay | Admitting: Internal Medicine

## 2013-12-25 DIAGNOSIS — M549 Dorsalgia, unspecified: Secondary | ICD-10-CM

## 2013-12-29 NOTE — Telephone Encounter (Signed)
Pt requesting med refill for Tramadol. Please f/u with pt.

## 2014-01-09 ENCOUNTER — Other Ambulatory Visit: Payer: Self-pay | Admitting: Neurosurgery

## 2014-01-09 DIAGNOSIS — M48061 Spinal stenosis, lumbar region without neurogenic claudication: Secondary | ICD-10-CM

## 2014-01-12 ENCOUNTER — Ambulatory Visit
Admission: RE | Admit: 2014-01-12 | Discharge: 2014-01-12 | Disposition: A | Discharge status: Home / Self Care | Payer: No Typology Code available for payment source | Source: Ambulatory Visit | Attending: Neurosurgery | Admitting: Neurosurgery

## 2014-01-12 DIAGNOSIS — M48061 Spinal stenosis, lumbar region without neurogenic claudication: Secondary | ICD-10-CM

## 2014-01-12 MED ORDER — IOHEXOL 180 MG/ML  SOLN
1.0000 mL | Freq: Once | INTRAMUSCULAR | Status: AC | PRN
  Administered 2014-01-12: 1 mL via EPIDURAL

## 2014-01-12 MED ORDER — METHYLPREDNISOLONE ACETATE 40 MG/ML INJ SUSP (RADIOLOG
120.0000 mg | Freq: Once | INTRAMUSCULAR | Status: AC
  Administered 2014-01-12: 120 mg via EPIDURAL

## 2014-01-12 NOTE — Discharge Instructions (Signed)

## 2014-01-23 ENCOUNTER — Other Ambulatory Visit: Payer: Self-pay | Admitting: Internal Medicine

## 2014-01-23 DIAGNOSIS — M549 Dorsalgia, unspecified: Secondary | ICD-10-CM

## 2014-01-23 MED ORDER — TRAMADOL HCL 50 MG PO TABS: ORAL_TABLET | ORAL | Status: DC

## 2014-01-23 NOTE — Telephone Encounter (Signed)
Pt. Needs refill for Tramadol.. Please call patient when refill is ready.

## 2014-02-06 ENCOUNTER — Other Ambulatory Visit: Payer: Self-pay | Admitting: Internal Medicine

## 2014-02-07 ENCOUNTER — Telehealth: Payer: Self-pay

## 2014-02-07 ENCOUNTER — Telehealth: Payer: Self-pay | Admitting: Internal Medicine

## 2014-02-07 NOTE — Telephone Encounter (Signed)
Pt has come in today to request a refill on traMADol (ULTRAM) 50 MG tablet; pt picked up last script on 6/12; please f/u with pt about medication refill instructions;

## 2014-02-07 NOTE — Telephone Encounter (Signed)
Patient requesting tramadol Informed patient that his Jordan Bradshaw was  Not here until tomorrow and asked that  He call tomorrow

## 2014-02-07 NOTE — Telephone Encounter (Signed)
Is it ok to refill medication Tramadol?

## 2014-02-08 ENCOUNTER — Other Ambulatory Visit: Payer: Self-pay | Admitting: *Deleted

## 2014-02-08 DIAGNOSIS — G894 Chronic pain syndrome: Secondary | ICD-10-CM

## 2014-02-08 MED ORDER — TRAMADOL HCL 50 MG PO TABS: ORAL_TABLET | ORAL | Status: DC

## 2014-02-08 NOTE — Progress Notes (Signed)
Pt came in today requesting another rx for tramadol. Pt received another rx.

## 2014-03-05 ENCOUNTER — Ambulatory Visit: Payer: No Typology Code available for payment source | Attending: Internal Medicine | Admitting: Internal Medicine

## 2014-03-05 ENCOUNTER — Encounter: Payer: Self-pay | Admitting: Internal Medicine

## 2014-03-05 VITALS — BP 140/80 | HR 80 | Temp 98.0°F | Resp 16 | Wt 199.4 lb

## 2014-03-05 DIAGNOSIS — J45909 Unspecified asthma, uncomplicated: Secondary | ICD-10-CM | POA: Insufficient documentation

## 2014-03-05 DIAGNOSIS — IMO0001 Reserved for inherently not codable concepts without codable children: Secondary | ICD-10-CM

## 2014-03-05 DIAGNOSIS — M549 Dorsalgia, unspecified: Secondary | ICD-10-CM

## 2014-03-05 DIAGNOSIS — G8929 Other chronic pain: Secondary | ICD-10-CM

## 2014-03-05 DIAGNOSIS — R030 Elevated blood-pressure reading, without diagnosis of hypertension: Secondary | ICD-10-CM

## 2014-03-05 DIAGNOSIS — Z87891 Personal history of nicotine dependence: Secondary | ICD-10-CM | POA: Insufficient documentation

## 2014-03-05 DIAGNOSIS — E559 Vitamin D deficiency, unspecified: Secondary | ICD-10-CM

## 2014-03-05 MED ORDER — CYCLOBENZAPRINE HCL 10 MG PO TABS: 10.0000 mg | ORAL_TABLET | Freq: Three times a day (TID) | ORAL | Status: DC

## 2014-03-05 MED ORDER — TAMSULOSIN HCL 0.4 MG PO CAPS: 0.4000 mg | ORAL_CAPSULE | Freq: Every day | ORAL | Status: DC

## 2014-03-05 MED ORDER — TRAMADOL HCL 50 MG PO TABS: ORAL_TABLET | ORAL | Status: DC

## 2014-03-05 MED ORDER — VITAMIN D (ERGOCALCIFEROL) 1.25 MG (50000 UNIT) PO CAPS: 50000.0000 [IU] | ORAL_CAPSULE | ORAL | Status: DC

## 2014-03-05 MED ORDER — IBUPROFEN 600 MG PO TABS: 600.0000 mg | ORAL_TABLET | Freq: Three times a day (TID) | ORAL | Status: DC | PRN

## 2014-03-05 MED ORDER — CLONIDINE HCL 0.1 MG PO TABS
0.1000 mg | ORAL_TABLET | Freq: Once | ORAL | Status: AC
  Administered 2014-03-05: 0.1 mg via ORAL

## 2014-03-05 NOTE — Patient Instructions (Signed)
DASH Eating Plan  DASH stands for "Dietary Approaches to Stop Hypertension." The DASH eating plan is a healthy eating plan that has been shown to reduce high blood pressure (hypertension). Additional health benefits may include reducing the risk of type 2 diabetes mellitus, heart disease, and stroke. The DASH eating plan may also help with weight loss.  WHAT DO I NEED TO KNOW ABOUT THE DASH EATING PLAN?  For the DASH eating plan, you will follow these general guidelines:  · Choose foods with a percent daily value for sodium of less than 5% (as listed on the food label).  · Use salt-free seasonings or herbs instead of table salt or sea salt.  · Check with your health care provider or pharmacist before using salt substitutes.  · Eat lower-sodium products, often labeled as "lower sodium" or "no salt added."  · Eat fresh foods.  · Eat more vegetables, fruits, and low-fat dairy products.  · Choose whole grains. Look for the word "whole" as the first word in the ingredient list.  · Choose fish and skinless chicken or turkey more often than red meat. Limit fish, poultry, and meat to 6 oz (170 g) each day.  · Limit sweets, desserts, sugars, and sugary drinks.  · Choose heart-healthy fats.  · Limit cheese to 1 oz (28 g) per day.  · Eat more home-cooked food and less restaurant, buffet, and fast food.  · Limit fried foods.  · Cook foods using methods other than frying.  · Limit canned vegetables. If you do use them, rinse them well to decrease the sodium.  · When eating at a restaurant, ask that your food be prepared with less salt, or no salt if possible.  WHAT FOODS CAN I EAT?  Seek help from a dietitian for individual calorie needs.  Grains  Whole grain or whole wheat bread. Brown rice. Whole grain or whole wheat pasta. Quinoa, bulgur, and whole grain cereals. Low-sodium cereals. Corn or whole wheat flour tortillas. Whole grain cornbread. Whole grain crackers. Low-sodium crackers.  Vegetables  Fresh or frozen vegetables  (raw, steamed, roasted, or grilled). Low-sodium or reduced-sodium tomato and vegetable juices. Low-sodium or reduced-sodium tomato sauce and paste. Low-sodium or reduced-sodium canned vegetables.   Fruits  All fresh, canned (in natural juice), or frozen fruits.  Meat and Other Protein Products  Ground beef (85% or leaner), grass-fed beef, or beef trimmed of fat. Skinless chicken or turkey. Ground chicken or turkey. Pork trimmed of fat. All fish and seafood. Eggs. Dried beans, peas, or lentils. Unsalted nuts and seeds. Unsalted canned beans.  Dairy  Low-fat dairy products, such as skim or 1% milk, 2% or reduced-fat cheeses, low-fat ricotta or cottage cheese, or plain low-fat yogurt. Low-sodium or reduced-sodium cheeses.  Fats and Oils  Tub margarines without trans fats. Light or reduced-fat mayonnaise and salad dressings (reduced sodium). Avocado. Safflower, olive, or canola oils. Natural peanut or almond butter.  Other  Unsalted popcorn and pretzels.  The items listed above may not be a complete list of recommended foods or beverages. Contact your dietitian for more options.  WHAT FOODS ARE NOT RECOMMENDED?  Grains  White bread. White pasta. White rice. Refined cornbread. Bagels and croissants. Crackers that contain trans fat.  Vegetables  Creamed or fried vegetables. Vegetables in a cheese sauce. Regular canned vegetables. Regular canned tomato sauce and paste. Regular tomato and vegetable juices.  Fruits  Dried fruits. Canned fruit in light or heavy syrup. Fruit juice.  Meat and Other Protein   Products  Fatty cuts of meat. Ribs, chicken wings, bacon, sausage, bologna, salami, chitterlings, fatback, hot dogs, bratwurst, and packaged luncheon meats. Salted nuts and seeds. Canned beans with salt.  Dairy  Whole or 2% milk, cream, half-and-half, and cream cheese. Whole-fat or sweetened yogurt. Full-fat cheeses or blue cheese. Nondairy creamers and whipped toppings. Processed cheese, cheese spreads, or cheese  curds.  Condiments  Onion and garlic salt, seasoned salt, table salt, and sea salt. Canned and packaged gravies. Worcestershire sauce. Tartar sauce. Barbecue sauce. Teriyaki sauce. Soy sauce, including reduced sodium. Steak sauce. Fish sauce. Oyster sauce. Cocktail sauce. Horseradish. Ketchup and mustard. Meat flavorings and tenderizers. Bouillon cubes. Hot sauce. Tabasco sauce. Marinades. Taco seasonings. Relishes.  Fats and Oils  Butter, stick margarine, lard, shortening, ghee, and bacon fat. Coconut, palm kernel, or palm oils. Regular salad dressings.  Other  Pickles and olives. Salted popcorn and pretzels.  The items listed above may not be a complete list of foods and beverages to avoid. Contact your dietitian for more information.  WHERE CAN I FIND MORE INFORMATION?  [employer], [employer], [employer]: www.nhlbi.nih.gov/health/health-topics/topics/dash/  Document Released: 07/23/2011 Document Revised: 08/08/2013 Document Reviewed: 06/07/2013  Exit[employer]® Patient Information ©2015 [employer]. This information is not intended to replace advice given to you by your health care provider. Make sure you discuss any questions you have with your health care provider.

## 2014-03-05 NOTE — Progress Notes (Signed)
Patient here for follow up and medication refill

## 2014-03-05 NOTE — Progress Notes (Signed)
MRN: [id number] Name: Jordan Bradshaw  Sex: male Age: 53 y.o. DOB: January 27, 1961  Allergies: Review of patient's allergies indicates no known allergies.  Chief Complaint  Patient presents with  . Follow-up    HPI: Patient is 53 y.o. male who has to of COPD, chronic back pain comes today for followup requesting refill on his medications, patient also follows up with the neurosurgeon and had steroid injections and the back, still complains of the pain, today's blood pressure is elevated denies any headache dizziness chest and shortness of breath, patient is on Advair and albuterol when necessary patient denies smoking cigarettes.   Past Medical History  Diagnosis Date  . Asthma   . COPD (chronic obstructive pulmonary disease)     Past Surgical History  Procedure Laterality Date  . Eye surgery        Medication List       This list is accurate as of: 03/05/14  9:49 AM.  Always use your most recent med list.               albuterol (2.5 MG/3ML) 0.083% nebulizer solution  Commonly known as:  PROVENTIL  Take 3 mLs (2.5 mg total) by nebulization every 6 (six) hours as needed for wheezing or shortness of breath.     albuterol 108 (90 BASE) MCG/ACT inhaler  Commonly known as:  PROVENTIL HFA;VENTOLIN HFA  Inhale 2 puffs into the lungs every 4 (four) hours as needed for wheezing or shortness of breath.     cyclobenzaprine 10 MG tablet  Commonly known as:  FLEXERIL  Take 1 tablet (10 mg total) by mouth 3 (three) times daily.     Fluticasone-Salmeterol 250-50 MCG/DOSE Aepb  Commonly known as:  ADVAIR  Inhale 1 puff into the lungs every 12 (twelve) hours.     ibuprofen 600 MG tablet  Commonly known as:  ADVIL,MOTRIN  Take 1 tablet (600 mg total) by mouth every 8 (eight) hours as needed for moderate pain.     meloxicam 15 MG tablet  Commonly known as:  MOBIC  Take 1 tablet (15 mg total) by mouth daily.     methylPREDNISolone 4 MG tablet  Commonly known as:  MEDROL DOSEPAK   follow package directions     tamsulosin 0.4 MG Caps capsule  Commonly known as:  FLOMAX  Take 1 capsule (0.4 mg total) by mouth daily.     traMADol 50 MG tablet  Commonly known as:  ULTRAM  TAKE 1 TABLET BY MOUTH EVERY 6 HOURS AS NEEDED FOR MODERATE PAIN     Vitamin D (Ergocalciferol) 50000 UNITS Caps capsule  Commonly known as:  DRISDOL  Take 1 capsule (50,000 Units total) by mouth every 7 (seven) days.        Meds ordered this encounter  Medications  . cloNIDine (CATAPRES) tablet 0.1 mg    Sig:   . cyclobenzaprine (FLEXERIL) 10 MG tablet    Sig: Take 1 tablet (10 mg total) by mouth 3 (three) times daily.    Dispense:  90 tablet    Refill:  3  . tamsulosin (FLOMAX) 0.4 MG CAPS capsule    Sig: Take 1 capsule (0.4 mg total) by mouth daily.    Dispense:  30 capsule    Refill:  3  . traMADol (ULTRAM) 50 MG tablet    Sig: TAKE 1 TABLET BY MOUTH EVERY 6 HOURS AS NEEDED FOR MODERATE PAIN    Dispense:  60 tablet    Refill:  0  .  ibuprofen (ADVIL,MOTRIN) 600 MG tablet    Sig: Take 1 tablet (600 mg total) by mouth every 8 (eight) hours as needed for moderate pain.    Dispense:  30 tablet    Refill:  3  . Vitamin D, Ergocalciferol, (DRISDOL) 50000 UNITS CAPS capsule    Sig: Take 1 capsule (50,000 Units total) by mouth every 7 (seven) days.    Dispense:  12 capsule    Refill:  0     There is no immunization history on file for this patient.  Family History  Problem Relation Age of Onset  . Lung cancer Sister     smoked  . Ovarian cancer Mother     History  Substance Use Topics  . Smoking status: Former Smoker -- 0.50 packs/day for 35 years    Types: Cigarettes  . Smokeless tobacco: Former Neurosurgeon    Quit date: 08/31/2013  . Alcohol Use: No    Review of Systems   As noted in HPI  Filed Vitals:   03/05/14 0949  BP: 140/80  Pulse:   Temp:   Resp:     Physical Exam  Physical Exam  Eyes: EOM are normal. Pupils are equal, round, and reactive to light.    Cardiovascular: Normal rate and regular rhythm.   Pulmonary/Chest:  Minimal wheezing  Musculoskeletal:  lower back spine and paraspinal tenderness    CBC    Component Value Date/Time   WBC 5.4 07/31/2013 1229   RBC 4.33 07/31/2013 1229   HGB 15.1 07/31/2013 1229   HCT 42.4 07/31/2013 1229   PLT 250 07/31/2013 1229   MCV 97.9 07/31/2013 1229   LYMPHSABS 2.8 07/31/2013 1229   MONOABS 0.6 07/31/2013 1229   EOSABS 0.1 07/31/2013 1229   BASOSABS 0.1 07/31/2013 1229    CMP     Component Value Date/Time   NA 140 07/31/2013 1229   K 4.1 07/31/2013 1229   CL 107 07/31/2013 1229   CO2 25 07/31/2013 1229   GLUCOSE 55* 07/31/2013 1229   BUN 3* 07/31/2013 1229   CREATININE 0.78 07/31/2013 1229   CALCIUM 9.3 07/31/2013 1229   PROT 6.5 07/31/2013 1229   ALBUMIN 3.8 07/31/2013 1229   AST 121* 07/31/2013 1229   ALT 40 07/31/2013 1229   ALKPHOS 97 07/31/2013 1229   BILITOT 0.8 07/31/2013 1229    No results found for this basename: chol,  tri,  ldl    No components found with this basename: hga1c    Lab Results  Component Value Date/Time   AST 121* 07/31/2013 12:29 PM    Assessment and Plan  Elevated blood pressure - Plan: cloNIDine (CATAPRES) tablet 0.1 mg, repeat manual blood pressure is 140/80, have advised patient for DASH diet  Unspecified vitamin D deficiency - Plan: Started patient on Vitamin D, Ergocalciferol, (DRISDOL) 50000 UNITS CAPS capsule  Chronic back pain - Plan: Medication refill done patient to followup  with neurosurgeon cyclobenzaprine (FLEXERIL) 10 MG tablet, ibuprofen (ADVIL,MOTRIN) 600 MG tablet    Return in about 3 months (around 06/05/2014) for back pain, COPD.  Doris Cheadle, MD

## 2014-03-27 ENCOUNTER — Other Ambulatory Visit: Payer: Self-pay | Admitting: Internal Medicine

## 2014-04-04 ENCOUNTER — Other Ambulatory Visit: Payer: Self-pay | Admitting: Internal Medicine

## 2014-06-15 ENCOUNTER — Ambulatory Visit: Payer: Self-pay | Attending: Internal Medicine | Admitting: Internal Medicine

## 2014-06-15 ENCOUNTER — Encounter: Payer: Self-pay | Admitting: Internal Medicine

## 2014-06-15 VITALS — BP 112/79 | HR 76 | Temp 97.5°F | Resp 18 | Ht 72.0 in | Wt 199.0 lb

## 2014-06-15 DIAGNOSIS — G8929 Other chronic pain: Secondary | ICD-10-CM | POA: Insufficient documentation

## 2014-06-15 DIAGNOSIS — R05 Cough: Secondary | ICD-10-CM

## 2014-06-15 DIAGNOSIS — J320 Chronic maxillary sinusitis: Secondary | ICD-10-CM | POA: Insufficient documentation

## 2014-06-15 DIAGNOSIS — M549 Dorsalgia, unspecified: Secondary | ICD-10-CM | POA: Insufficient documentation

## 2014-06-15 DIAGNOSIS — J449 Chronic obstructive pulmonary disease, unspecified: Secondary | ICD-10-CM | POA: Insufficient documentation

## 2014-06-15 DIAGNOSIS — R0981 Nasal congestion: Secondary | ICD-10-CM

## 2014-06-15 DIAGNOSIS — R059 Cough, unspecified: Secondary | ICD-10-CM

## 2014-06-15 DIAGNOSIS — J45909 Unspecified asthma, uncomplicated: Secondary | ICD-10-CM | POA: Insufficient documentation

## 2014-06-15 DIAGNOSIS — Z87891 Personal history of nicotine dependence: Secondary | ICD-10-CM | POA: Insufficient documentation

## 2014-06-15 MED ORDER — TRAMADOL HCL 50 MG PO TABS: 50.0000 mg | ORAL_TABLET | Freq: Three times a day (TID) | ORAL | Status: DC | PRN

## 2014-06-15 MED ORDER — FLUTICASONE PROPIONATE 50 MCG/ACT NA SUSP: 2.0000 | Freq: Every day | NASAL | Status: AC

## 2014-06-15 MED ORDER — BENZONATATE 100 MG PO CAPS: 100.0000 mg | ORAL_CAPSULE | Freq: Three times a day (TID) | ORAL | Status: DC | PRN

## 2014-06-15 MED ORDER — CYCLOBENZAPRINE HCL 10 MG PO TABS: 10.0000 mg | ORAL_TABLET | Freq: Three times a day (TID) | ORAL | Status: DC

## 2014-06-15 MED ORDER — AMOXICILLIN-POT CLAVULANATE 875-125 MG PO TABS: 1.0000 | ORAL_TABLET | Freq: Two times a day (BID) | ORAL | Status: DC

## 2014-06-15 MED ORDER — IBUPROFEN 600 MG PO TABS: 600.0000 mg | ORAL_TABLET | Freq: Three times a day (TID) | ORAL | Status: DC | PRN

## 2014-06-15 NOTE — Progress Notes (Signed)
MRN: [id number] Name: Jordan Bradshaw  Sex: male Age: 53 y.o. DOB: Jan 10, 1961  Allergies: Review of patient's allergies indicates no known allergies.  Chief Complaint  Patient presents with  . Follow-up    HPI: Patient is 53 y.o. male who has history of chronic back pain, COPD comes today complaining of nasal congestion postnasal drip fever chills productive cough for the last few weeks as per patient he has used his inhaler Advair and albuterol he used was 2-3 days ago , patient is requesting refill on his pain medications. Patient currently denies smoking cigarettes. He has tried over-the-counter medications without much improvement.  Past Medical History  Diagnosis Date  . Asthma   . COPD (chronic obstructive pulmonary disease)     Past Surgical History  Procedure Laterality Date  . Eye surgery        Medication List       This list is accurate as of: 06/15/14 10:45 AM.  Always use your most recent med list.               albuterol (2.5 MG/3ML) 0.083% nebulizer solution  Commonly known as:  PROVENTIL  Take 3 mLs (2.5 mg total) by nebulization every 6 (six) hours as needed for wheezing or shortness of breath.     albuterol 108 (90 BASE) MCG/ACT inhaler  Commonly known as:  PROVENTIL HFA;VENTOLIN HFA  Inhale 2 puffs into the lungs every 4 (four) hours as needed for wheezing or shortness of breath.     amoxicillin-clavulanate 875-125 MG per tablet  Commonly known as:  AUGMENTIN  Take 1 tablet by mouth 2 (two) times daily.     benzonatate 100 MG capsule  Commonly known as:  TESSALON  Take 1 capsule (100 mg total) by mouth 3 (three) times daily as needed for cough.     cyclobenzaprine 10 MG tablet  Commonly known as:  FLEXERIL  Take 1 tablet (10 mg total) by mouth 3 (three) times daily.     fluticasone 50 MCG/ACT nasal spray  Commonly known as:  FLONASE  Place 2 sprays into both nostrils daily.     Fluticasone-Salmeterol 250-50 MCG/DOSE Aepb  Commonly  known as:  ADVAIR  Inhale 1 puff into the lungs every 12 (twelve) hours.     ibuprofen 600 MG tablet  Commonly known as:  ADVIL,MOTRIN  Take 1 tablet (600 mg total) by mouth every 8 (eight) hours as needed for moderate pain.     meloxicam 15 MG tablet  Commonly known as:  MOBIC  Take 1 tablet (15 mg total) by mouth daily.     methylPREDNISolone 4 MG tablet  Commonly known as:  MEDROL DOSEPAK  follow package directions     tamsulosin 0.4 MG Caps capsule  Commonly known as:  FLOMAX  Take 1 capsule (0.4 mg total) by mouth daily.     traMADol 50 MG tablet  Commonly known as:  ULTRAM  Take 1 tablet (50 mg total) by mouth every 8 (eight) hours as needed.     Vitamin D (Ergocalciferol) 50000 UNITS Caps capsule  Commonly known as:  DRISDOL  Take 1 capsule (50,000 Units total) by mouth every 7 (seven) days.        Meds ordered this encounter  Medications  . cyclobenzaprine (FLEXERIL) 10 MG tablet    Sig: Take 1 tablet (10 mg total) by mouth 3 (three) times daily.    Dispense:  90 tablet    Refill:  3  . ibuprofen (  ADVIL,MOTRIN) 600 MG tablet    Sig: Take 1 tablet (600 mg total) by mouth every 8 (eight) hours as needed for moderate pain.    Dispense:  30 tablet    Refill:  3  . amoxicillin-clavulanate (AUGMENTIN) 875-125 MG per tablet    Sig: Take 1 tablet by mouth 2 (two) times daily.    Dispense:  14 tablet    Refill:  0  . traMADol (ULTRAM) 50 MG tablet    Sig: Take 1 tablet (50 mg total) by mouth every 8 (eight) hours as needed.    Dispense:  90 tablet    Refill:  0  . benzonatate (TESSALON) 100 MG capsule    Sig: Take 1 capsule (100 mg total) by mouth 3 (three) times daily as needed for cough.    Dispense:  30 capsule    Refill:  1  . fluticasone (FLONASE) 50 MCG/ACT nasal spray    Sig: Place 2 sprays into both nostrils daily.    Dispense:  16 g    Refill:  6     There is no immunization history on file for this patient.  Family History  Problem Relation Age  of Onset  . Lung cancer Sister     smoked  . Ovarian cancer Mother     History  Substance Use Topics  . Smoking status: Former Smoker -- 0.50 packs/day for 35 years    Types: Cigarettes  . Smokeless tobacco: Former NeurosurgeonUser    Quit date: 08/31/2013  . Alcohol Use: No    Review of Systems   As noted in HPI  Filed Vitals:   06/15/14 1012  BP: 112/79  Pulse: 76  Temp: 97.5 F (36.4 C)  Resp: 18    Physical Exam  Physical Exam  HENT:  Nasal congestion sinus tenderness   Eyes: EOM are normal. Pupils are equal, round, and reactive to light.  Cardiovascular: Normal rate and regular rhythm.   Pulmonary/Chest:  Minimal wheezing  Abdominal: He exhibits no distension. There is no tenderness. There is no rebound.  Musculoskeletal: He exhibits no edema.    CBC    Component Value Date/Time   WBC 5.4 07/31/2013 1229   RBC 4.33 07/31/2013 1229   HGB 15.1 07/31/2013 1229   HCT 42.4 07/31/2013 1229   PLT 250 07/31/2013 1229   MCV 97.9 07/31/2013 1229   LYMPHSABS 2.8 07/31/2013 1229   MONOABS 0.6 07/31/2013 1229   EOSABS 0.1 07/31/2013 1229   BASOSABS 0.1 07/31/2013 1229    CMP     Component Value Date/Time   NA 140 07/31/2013 1229   K 4.1 07/31/2013 1229   CL 107 07/31/2013 1229   CO2 25 07/31/2013 1229   GLUCOSE 55* 07/31/2013 1229   BUN 3* 07/31/2013 1229   CREATININE 0.78 07/31/2013 1229   CALCIUM 9.3 07/31/2013 1229   PROT 6.5 07/31/2013 1229   ALBUMIN 3.8 07/31/2013 1229   AST 121* 07/31/2013 1229   ALT 40 07/31/2013 1229   ALKPHOS 97 07/31/2013 1229   BILITOT 0.8 07/31/2013 1229    No results found for this basename: chol, tri, ldl    No components found with this basename: hga1c    Lab Results  Component Value Date/Time   AST 121* 07/31/2013 12:29 PM    Assessment and Plan  Chronic back pain - Plan: Patient is given refill on the medications cyclobenzaprine (FLEXERIL) 10 MG tablet, ibuprofen (ADVIL,MOTRIN) 600 MG tablet, traMADol (ULTRAM) 50  MG tablet  Maxillary  sinusitis, unspecified chronicity - Plan: amoxicillin-clavulanate (AUGMENTIN) 875-125 MG per tablet  Nasal congestion - Plan: fluticasone (FLONASE) 50 MCG/ACT nasal spray, advised patient for saltwater gargles.  Cough - Plan: benzonatate (TESSALON) 100 MG capsule  COPD GOLD II Currently on Advair as well as use albuterol when necessary   Return in about 3 months (around 09/15/2014).  Doris Cheadle, MD

## 2014-06-15 NOTE — Progress Notes (Signed)
Pt is here following up on his asthma and COPD. Pt states that he in extreme pain in his joints, back and neck. Pt has been off of his pain medications for a few months b/c he didn't want to get addicted. He said that he started drinking more alcohol to deal with his pain.

## 2014-07-04 ENCOUNTER — Ambulatory Visit: Payer: Self-pay | Attending: Internal Medicine

## 2014-07-04 ENCOUNTER — Telehealth: Payer: Self-pay | Admitting: Internal Medicine

## 2014-07-04 NOTE — Telephone Encounter (Signed)
Patient got the last prescription on 06/15/2014  He is not due for refill

## 2014-07-04 NOTE — Telephone Encounter (Signed)
Pt calling to request refill for traMADol (ULTRAM) 50 MG tablet [[id number]][[id number]]. Please f/u with pt.

## 2014-07-04 NOTE — Telephone Encounter (Signed)
  Pt calling to request refill for traMADol (ULTRAM) 50 MG tablet [[id number]][[id number]]. Please f/u with pt.

## 2014-07-05 ENCOUNTER — Telehealth: Payer: Self-pay | Admitting: Emergency Medicine

## 2014-07-05 NOTE — Telephone Encounter (Signed)
Pt made aware we cant refill Tramadol at this time. Last given 10/30 Pt states he called to request refill before Christmas

## 2014-07-23 ENCOUNTER — Telehealth: Payer: Self-pay | Admitting: Internal Medicine

## 2014-07-23 NOTE — Telephone Encounter (Signed)
Pt. Walked in and would like a refill on his Tramadol....the patient. [facility]Marland Kitchen.Marland Kitchen..Marland Kitchen

## 2014-07-23 NOTE — Telephone Encounter (Signed)
Pt. Would like a refill on his tramadol.Marland Kitchen.Marland Kitchen.Marland Kitchen.Marland Kitchen.[facility]....please f/u with patient

## 2014-07-27 ENCOUNTER — Telehealth: Payer: Self-pay | Admitting: Internal Medicine

## 2014-07-27 NOTE — Telephone Encounter (Signed)
Pt's wife came to check on the status of the medication refill request for Tramadol, please f/u.

## 2014-07-27 NOTE — Telephone Encounter (Signed)
Expand All Collapse All   Pt. Would like a refill on his tramadol.Marland Kitchen.Marland Kitchen.Marland Kitchen.Marland Kitchen.[facility]....please f/u with patient        Last prescribed 06/15/14

## 2014-07-30 NOTE — Telephone Encounter (Signed)
Check if Patient is due for refill he can be given refill on the medication

## 2014-08-03 ENCOUNTER — Telehealth: Payer: Self-pay | Admitting: Internal Medicine

## 2014-08-03 DIAGNOSIS — M549 Dorsalgia, unspecified: Principal | ICD-10-CM

## 2014-08-03 DIAGNOSIS — G8929 Other chronic pain: Secondary | ICD-10-CM

## 2014-08-03 MED ORDER — TRAMADOL HCL 50 MG PO TABS: 50.0000 mg | ORAL_TABLET | Freq: Three times a day (TID) | ORAL | Status: DC | PRN

## 2014-08-03 NOTE — Telephone Encounter (Signed)
Okay to refill Tramadol per PCP

## 2014-08-21 ENCOUNTER — Ambulatory Visit: Payer: Self-pay

## 2014-09-03 ENCOUNTER — Telehealth: Payer: Self-pay | Admitting: *Deleted

## 2014-09-03 NOTE — Telephone Encounter (Signed)
Pt girlfriend is in the lobby requesting refill Rx tamadol

## 2014-09-04 ENCOUNTER — Other Ambulatory Visit: Payer: Self-pay | Admitting: Internal Medicine

## 2014-09-06 ENCOUNTER — Other Ambulatory Visit: Payer: Self-pay

## 2014-09-06 ENCOUNTER — Telehealth: Payer: Self-pay

## 2014-09-06 DIAGNOSIS — M549 Dorsalgia, unspecified: Principal | ICD-10-CM

## 2014-09-06 DIAGNOSIS — G8929 Other chronic pain: Secondary | ICD-10-CM

## 2014-09-06 IMAGING — CT CT CERVICAL SPINE W/O CM
4 series · 16 of 33 positions shown, 19 images · non-contrast
Comparison: None.

CLINICAL DATA: Tingling along the right side of the body. Chronic
back pain. Acute pain.

EXAM:
CT CERVICAL SPINE WITHOUT CONTRAST
TECHNIQUE: Multidetector CT imaging of the cervical spine was performed without
intravenous contrast. Multiplanar CT image reconstructions were also
generated.

[Series 3: soft tissue · axial · 0.29mm/px · z∈[+1006,+1078]mm · 3 of 110 slices shown]
[im 19/110  soft-tissue]
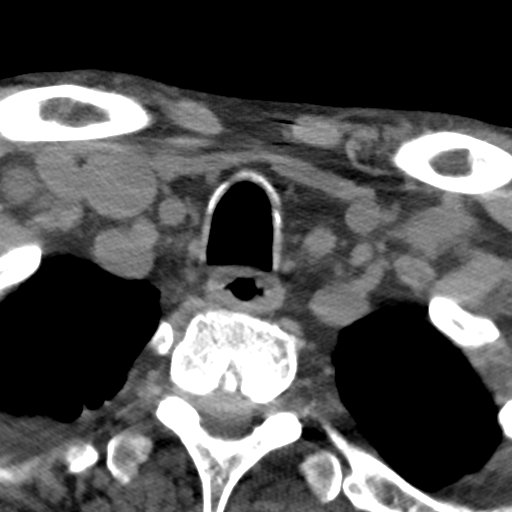
[im 37/110  soft-tissue]
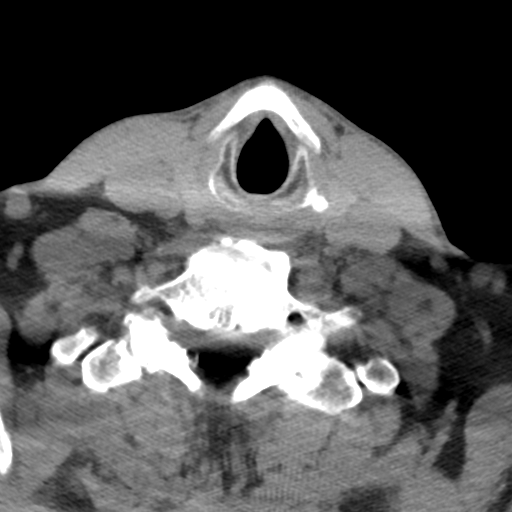
[im 55/110  soft-tissue]
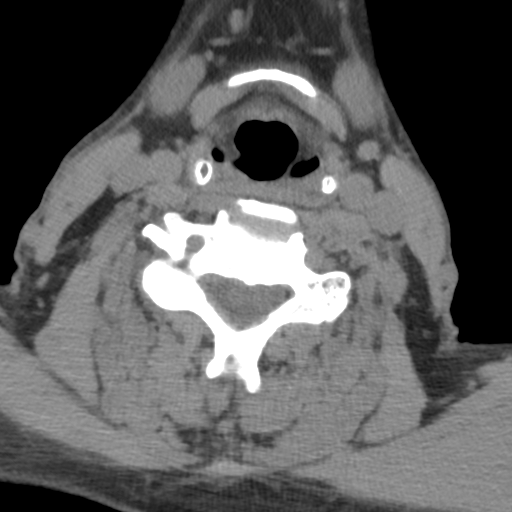

[sagittal c spine · sagittal · 0.49mm/px · 5 of 61 slices shown, 6 images]
[im 21/61  bone]
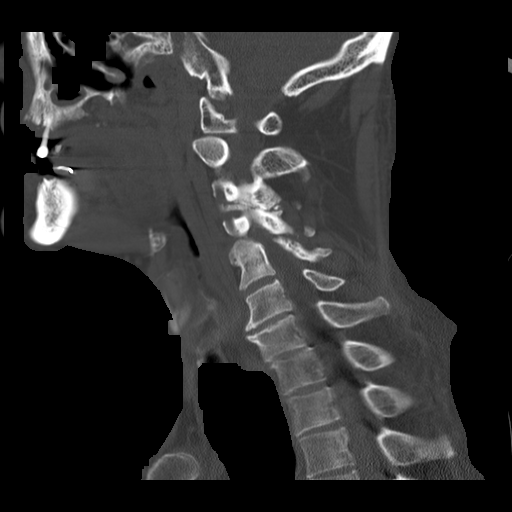
[im 26/61  bone]
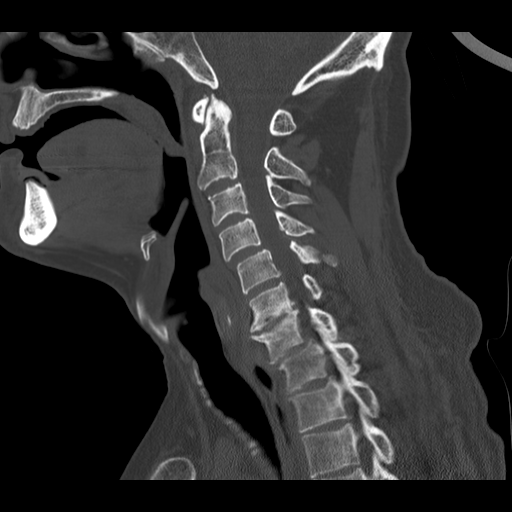
[im 31/61  soft-tissue]
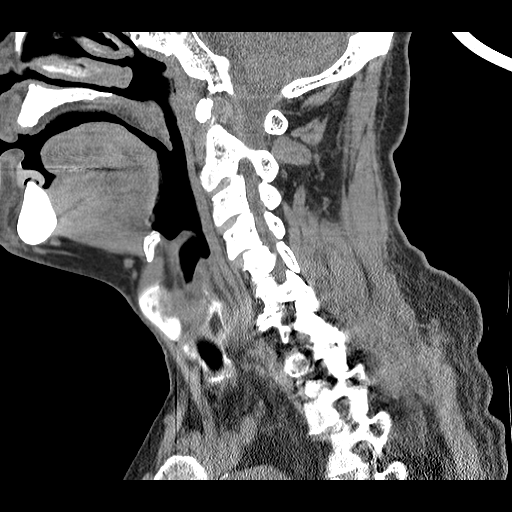
[im 31/61  bone]
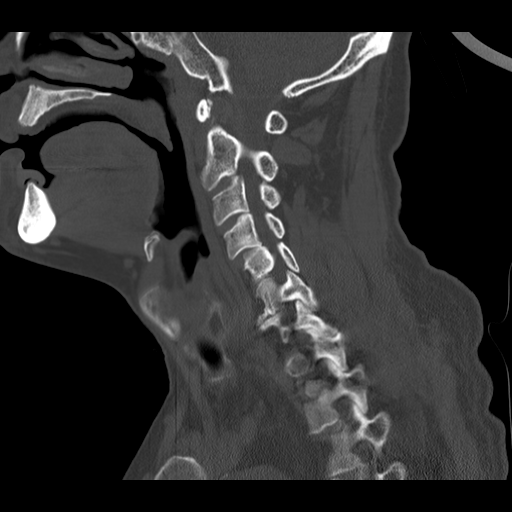
[im 36/61  bone]
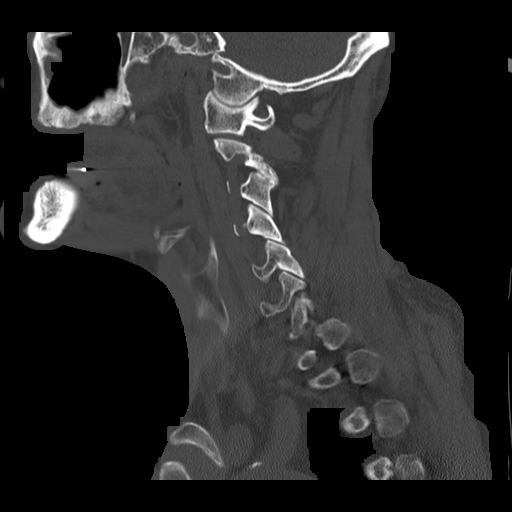
[im 41/61  bone]
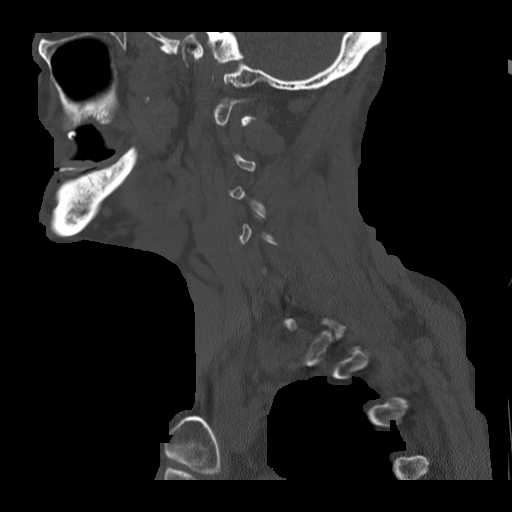

[coronal c spine · coronal · 0.48mm/px · 3 of 48 slices shown]
[im 10/48  bone]
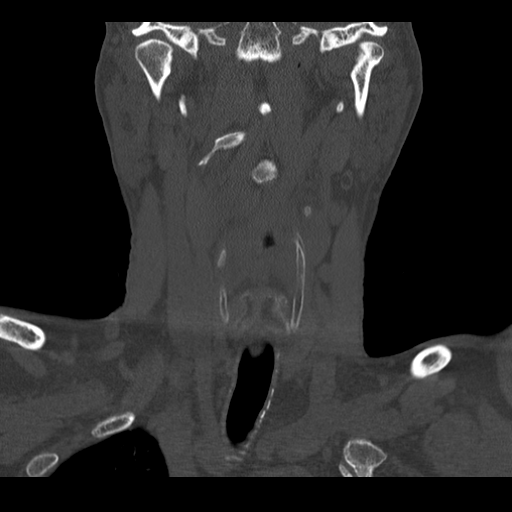
[im 19/48  bone]
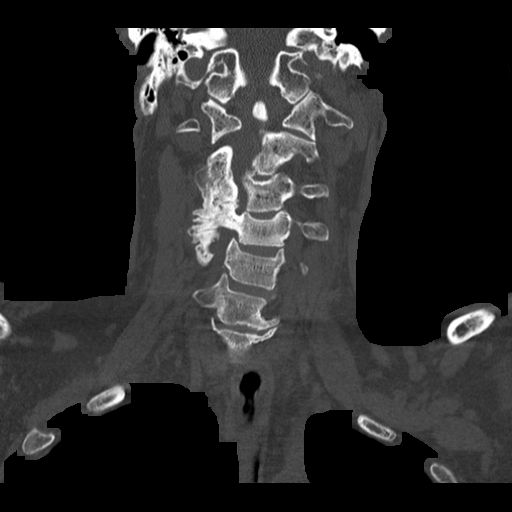
[im 29/48  bone]
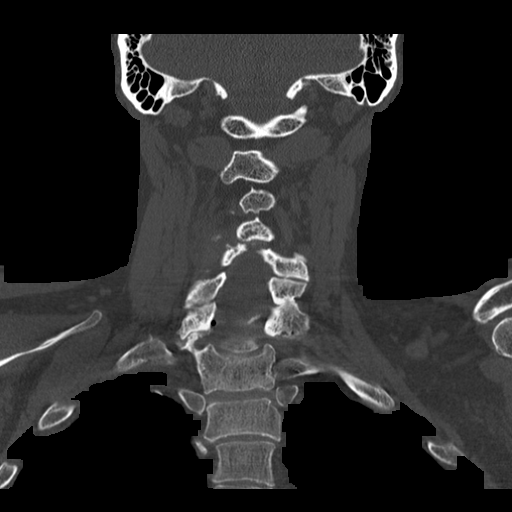

[axial c spine · axial · 0.48mm/px · z∈[+997,+1124]mm · 5 of 99 slices shown, 7 images]
[im 17/99  soft-tissue]
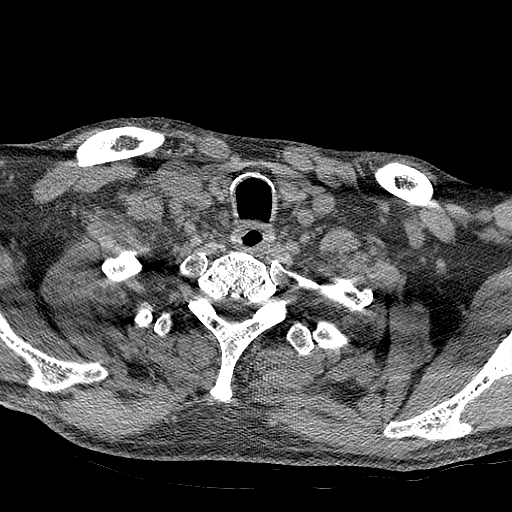
[im 17/99  bone]
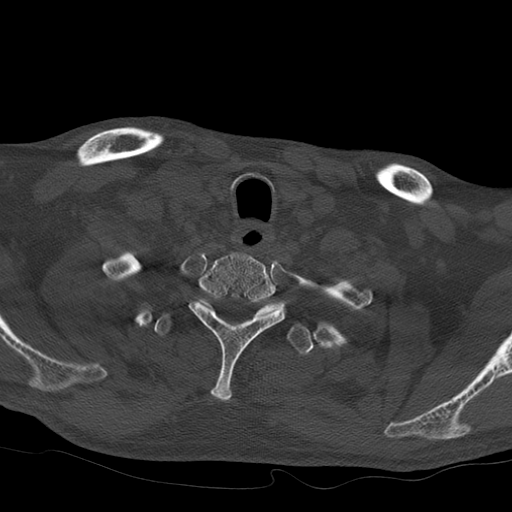
[im 33/99  bone]
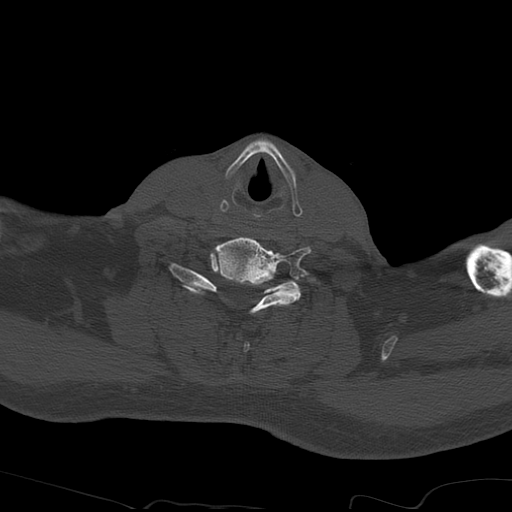
[im 50/99  bone]
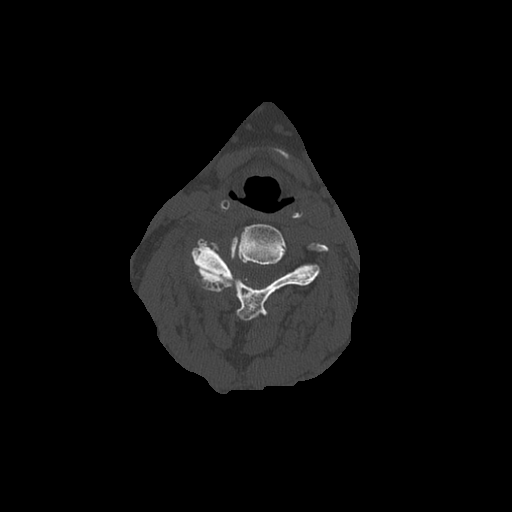
[im 66/99  bone]
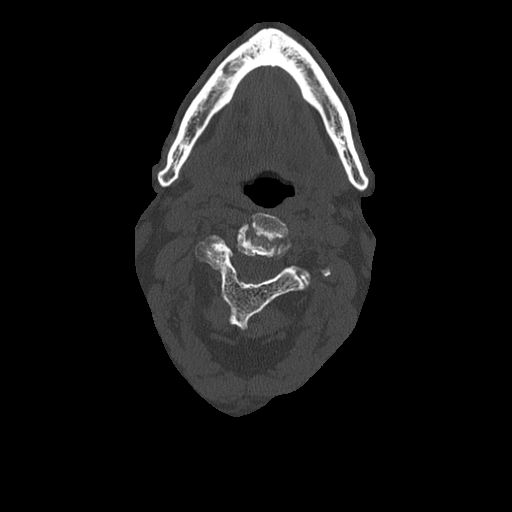
[im 82/99  soft-tissue]
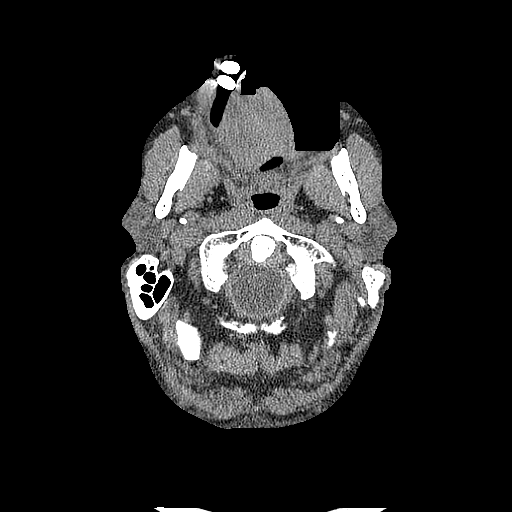
[im 82/99  bone]
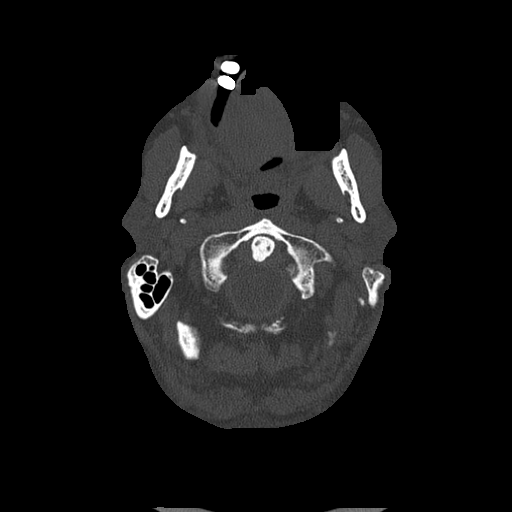

[16 of 33 positions shown; findings below may reference images not displayed]

FINDINGS: There is fusion of posterior elements at C2-3 and C3-4. This may be
congenital. The fusion is more extensive on the right. Asymmetric
right-sided facet arthropathy is evident at C3-4. Degenerative
anterolisthesis is then present at C4-5 measuring 3 mm. Asymmetric
right-sided facet spurring is present on the right. This results and
mild to moderate right foraminal stenosis at C2-3, C3-4, and C4-5. A
left foramina are patent at these levels.

A disc osteophyte complex is present at C5-6 with mild central canal
narrowing worse on the right. The foramina are patent.

Uncovertebral and facet spurring at C6-7 is worse on the left.
Chronic endplate changes are noted.

Advanced facet hypertrophy is present at C7-T1 with slight
anterolisthesis. The facet spurring contributes to mild foraminal
narrowing bilaterally, right greater than left.
IMPRESSION: 1. Fusion of posterior elements at C[DATE] be congenital. This is
more extensive on the right.
2. Marked asymmetric facet arthropathy on the right at C3-4.
3. Mild to moderate right foraminal stenosis at C2-3, C3-4, and
C4-5.
4. Mild central canal stenosis at C5-6 is worse on the right due to
a disc osteophyte complex.
5. Uncovertebral and facet spurring at C6-7 is worse on the left.
6. Advanced facet hypertrophy and spurring at C7-T1 with right
greater than left mild foraminal narrowing.
7. Leftward curvature of the upper cervical spine secondary to the
fusion and facet arthrosis. Compensatory rightward curvature is
present at C6-7.

## 2014-09-06 MED ORDER — TRAMADOL HCL 50 MG PO TABS: 50.0000 mg | ORAL_TABLET | Freq: Three times a day (TID) | ORAL | Status: DC | PRN

## 2014-09-06 NOTE — Telephone Encounter (Signed)
Patient called requesting refill on his tramadol Prescription printed and is available at the front desk

## 2014-10-02 ENCOUNTER — Encounter: Payer: Self-pay | Admitting: Internal Medicine

## 2014-10-02 ENCOUNTER — Ambulatory Visit: Payer: Self-pay | Attending: Internal Medicine | Admitting: Internal Medicine

## 2014-10-02 VITALS — BP 130/80 | HR 93 | Temp 98.0°F | Resp 16 | Wt 203.6 lb

## 2014-10-02 DIAGNOSIS — G8929 Other chronic pain: Secondary | ICD-10-CM | POA: Insufficient documentation

## 2014-10-02 DIAGNOSIS — Z139 Encounter for screening, unspecified: Secondary | ICD-10-CM

## 2014-10-02 DIAGNOSIS — R202 Paresthesia of skin: Secondary | ICD-10-CM | POA: Insufficient documentation

## 2014-10-02 DIAGNOSIS — Z792 Long term (current) use of antibiotics: Secondary | ICD-10-CM | POA: Insufficient documentation

## 2014-10-02 DIAGNOSIS — Z7951 Long term (current) use of inhaled steroids: Secondary | ICD-10-CM | POA: Insufficient documentation

## 2014-10-02 DIAGNOSIS — Z23 Encounter for immunization: Secondary | ICD-10-CM | POA: Insufficient documentation

## 2014-10-02 DIAGNOSIS — J449 Chronic obstructive pulmonary disease, unspecified: Secondary | ICD-10-CM | POA: Insufficient documentation

## 2014-10-02 DIAGNOSIS — Z87891 Personal history of nicotine dependence: Secondary | ICD-10-CM | POA: Insufficient documentation

## 2014-10-02 DIAGNOSIS — M549 Dorsalgia, unspecified: Secondary | ICD-10-CM | POA: Insufficient documentation

## 2014-10-02 DIAGNOSIS — Z791 Long term (current) use of non-steroidal anti-inflammatories (NSAID): Secondary | ICD-10-CM | POA: Insufficient documentation

## 2014-10-02 DIAGNOSIS — E559 Vitamin D deficiency, unspecified: Secondary | ICD-10-CM | POA: Insufficient documentation

## 2014-10-02 DIAGNOSIS — J45909 Unspecified asthma, uncomplicated: Secondary | ICD-10-CM | POA: Insufficient documentation

## 2014-10-02 LAB — COMPLETE METABOLIC PANEL WITH GFR
ALK PHOS: 61 U/L (ref 39–117)
ALT: 32 U/L (ref 0–53)
AST: 60 U/L — ABNORMAL HIGH (ref 0–37)
Albumin: 3.8 g/dL (ref 3.5–5.2)
BILIRUBIN TOTAL: 0.8 mg/dL (ref 0.2–1.2)
BUN: 4 mg/dL — ABNORMAL LOW (ref 6–23)
CO2: 23 meq/L (ref 19–32)
CREATININE: 0.8 mg/dL (ref 0.50–1.35)
Calcium: 9.8 mg/dL (ref 8.4–10.5)
Chloride: 107 mEq/L (ref 96–112)
GFR, Est African American: >89 mL/min
GFR, Est Non African American: >89 mL/min
Glucose, Bld: 76 mg/dL (ref 70–99)
Potassium: 5.2 mEq/L (ref 3.5–5.3)
SODIUM: 140 meq/L (ref 135–145)
Total Protein: 6.8 g/dL (ref 6.0–8.3)

## 2014-10-02 LAB — CBC WITH DIFFERENTIAL/PLATELET
Basophils Absolute: 0.1 10*3/uL (ref 0.0–0.1)
Basophils Relative: 1 % (ref 0–1)
Eosinophils Absolute: 0.3 10*3/uL (ref 0.0–0.7)
Eosinophils Relative: 4 % (ref 0–5)
HCT: 44.7 % (ref 39.0–52.0)
Hemoglobin: 15.8 g/dL (ref 13.0–17.0)
LYMPHS PCT: 53 % — AB (ref 12–46)
Lymphs Abs: 3.5 10*3/uL (ref 0.7–4.0)
MCH: 35.2 pg — ABNORMAL HIGH (ref 26.0–34.0)
MCHC: 35.3 g/dL (ref 30.0–36.0)
MCV: 99.6 fL (ref 78.0–100.0)
MONO ABS: 0.5 10*3/uL (ref 0.1–1.0)
MPV: 9.8 fL (ref 8.6–12.4)
Monocytes Relative: 7 % (ref 3–12)
NEUTROS ABS: 2.3 10*3/uL (ref 1.7–7.7)
Neutrophils Relative %: 35 % — ABNORMAL LOW (ref 43–77)
Platelets: 258 10*3/uL (ref 150–400)
RBC: 4.49 MIL/uL (ref 4.22–5.81)
RDW: 13.4 % (ref 11.5–15.5)
WBC: 6.6 10*3/uL (ref 4.0–10.5)

## 2014-10-02 LAB — LIPID PANEL
CHOL/HDL RATIO: 2.1 ratio
Cholesterol: 143 mg/dL (ref 0–200)
HDL: 69 mg/dL (ref >39)
LDL Cholesterol: 56 mg/dL (ref 0–99)
TRIGLYCERIDES: 90 mg/dL (ref <150)
VLDL: 18 mg/dL (ref 0–40)

## 2014-10-02 LAB — VITAMIN B12: Vitamin B-12: 358 pg/mL (ref 211–911)

## 2014-10-02 MED ORDER — VITAMIN D (ERGOCALCIFEROL) 1.25 MG (50000 UNIT) PO CAPS: 50000.0000 [IU] | ORAL_CAPSULE | ORAL | Status: DC

## 2014-10-02 MED ORDER — GABAPENTIN 300 MG PO CAPS: 300.0000 mg | ORAL_CAPSULE | Freq: Three times a day (TID) | ORAL | Status: DC

## 2014-10-02 MED ORDER — ALBUTEROL SULFATE HFA 108 (90 BASE) MCG/ACT IN AERS: 2.0000 | INHALATION_SPRAY | RESPIRATORY_TRACT | Status: DC | PRN

## 2014-10-02 NOTE — Progress Notes (Signed)
MRN: [id number] Name: Jordan Bradshaw  Sex: male Age: 54 y.o. DOB: 03-Oct-1960  Allergies: Review of patient's allergies indicates no known allergies.  Chief Complaint  Patient presents with  . Follow-up    HPI: Patient is 54 y.o. male who has chronic lower back pain, COPD comes today for followup and requesting refill on his pain medication, he was given tramadol last month, patient has already been referred to neurosurgery, patient today is also complaining of some numbness tingling in his left foot, as per patient he fell a few days ago when he was coming down on the stairs, denies any head trauma since then he had some pain in the left foot especially numbness tingling which is worse at night, she denies any worsening of her lower back pain denies any incontinence.  Past Medical History  Diagnosis Date  . Asthma   . COPD (chronic obstructive pulmonary disease)     Past Surgical History  Procedure Laterality Date  . Eye surgery        Medication List       This list is accurate as of: 10/02/14 12:33 PM.  Always use your most recent med list.               albuterol (2.5 MG/3ML) 0.083% nebulizer solution  Commonly known as:  PROVENTIL  Take 3 mLs (2.5 mg total) by nebulization every 6 (six) hours as needed for wheezing or shortness of breath.     albuterol 108 (90 BASE) MCG/ACT inhaler  Commonly known as:  PROVENTIL HFA;VENTOLIN HFA  Inhale 2 puffs into the lungs every 4 (four) hours as needed for wheezing or shortness of breath.     amoxicillin-clavulanate 875-125 MG per tablet  Commonly known as:  AUGMENTIN  Take 1 tablet by mouth 2 (two) times daily.     benzonatate 100 MG capsule  Commonly known as:  TESSALON  Take 1 capsule (100 mg total) by mouth 3 (three) times daily as needed for cough.     cyclobenzaprine 10 MG tablet  Commonly known as:  FLEXERIL  Take 1 tablet (10 mg total) by mouth 3 (three) times daily.     fluticasone 50 MCG/ACT nasal spray    Commonly known as:  FLONASE  Place 2 sprays into both nostrils daily.     Fluticasone-Salmeterol 250-50 MCG/DOSE Aepb  Commonly known as:  ADVAIR  Inhale 1 puff into the lungs every 12 (twelve) hours.     gabapentin 300 MG capsule  Commonly known as:  NEURONTIN  Take 1 capsule (300 mg total) by mouth 3 (three) times daily.     ibuprofen 600 MG tablet  Commonly known as:  ADVIL,MOTRIN  Take 1 tablet (600 mg total) by mouth every 8 (eight) hours as needed for moderate pain.     meloxicam 15 MG tablet  Commonly known as:  MOBIC  Take 1 tablet (15 mg total) by mouth daily.     methylPREDNISolone 4 MG tablet  Commonly known as:  MEDROL DOSEPAK  follow package directions     tamsulosin 0.4 MG Caps capsule  Commonly known as:  FLOMAX  Take 1 capsule (0.4 mg total) by mouth daily.     traMADol 50 MG tablet  Commonly known as:  ULTRAM  Take 1 tablet (50 mg total) by mouth every 8 (eight) hours as needed.     Vitamin D (Ergocalciferol) 50000 UNITS Caps capsule  Commonly known as:  DRISDOL  Take 1 capsule (50,000 Units  total) by mouth every 7 (seven) days.        Meds ordered this encounter  Medications  . albuterol (PROVENTIL HFA;VENTOLIN HFA) 108 (90 BASE) MCG/ACT inhaler    Sig: Inhale 2 puffs into the lungs every 4 (four) hours as needed for wheezing or shortness of breath.    Dispense:  3 Inhaler    Refill:  4  . Vitamin D, Ergocalciferol, (DRISDOL) 50000 UNITS CAPS capsule    Sig: Take 1 capsule (50,000 Units total) by mouth every 7 (seven) days.    Dispense:  12 capsule    Refill:  0  . gabapentin (NEURONTIN) 300 MG capsule    Sig: Take 1 capsule (300 mg total) by mouth 3 (three) times daily.    Dispense:  90 capsule    Refill:  3     There is no immunization history on file for this patient.  Family History  Problem Relation Age of Onset  . Lung cancer Sister     smoked  . Ovarian cancer Mother     History  Substance Use Topics  . Smoking status:  Former Smoker -- 0.50 packs/day for 35 years    Types: Cigarettes  . Smokeless tobacco: Former NeurosurgeonUser    Quit date: 08/31/2013  . Alcohol Use: No    Review of Systems   As noted in HPI  Filed Vitals:   10/02/14 1229  BP: 130/80  Pulse:   Temp:   Resp:     Physical Exam  Physical Exam  Constitutional: No distress.  Eyes: EOM are normal. Pupils are equal, round, and reactive to light.  Cardiovascular: Normal rate and regular rhythm.   Pulmonary/Chest: Breath sounds normal. No respiratory distress. He has no wheezes. He has no rales.  Musculoskeletal:  Left foot no swelling or tenderness, 2+ dorsalis pedis pulse, ankle good range of motion.    CBC    Component Value Date/Time   WBC 5.4 07/31/2013 1229   RBC 4.33 07/31/2013 1229   HGB 15.1 07/31/2013 1229   HCT 42.4 07/31/2013 1229   PLT 250 07/31/2013 1229   MCV 97.9 07/31/2013 1229   LYMPHSABS 2.8 07/31/2013 1229   MONOABS 0.6 07/31/2013 1229   EOSABS 0.1 07/31/2013 1229   BASOSABS 0.1 07/31/2013 1229    CMP     Component Value Date/Time   NA 140 07/31/2013 1229   K 4.1 07/31/2013 1229   CL 107 07/31/2013 1229   CO2 25 07/31/2013 1229   GLUCOSE 55* 07/31/2013 1229   BUN 3* 07/31/2013 1229   CREATININE 0.78 07/31/2013 1229   CALCIUM 9.3 07/31/2013 1229   PROT 6.5 07/31/2013 1229   ALBUMIN 3.8 07/31/2013 1229   AST 121* 07/31/2013 1229   ALT 40 07/31/2013 1229   ALKPHOS 97 07/31/2013 1229   BILITOT 0.8 07/31/2013 1229    No results found for: CHOL  No components found for: HGA1C  Lab Results  Component Value Date/Time   AST 121* 07/31/2013 12:29 PM    Assessment and Plan  Chronic back pain Patient is currently taking tramadol, not due for refill today.  Vitamin D deficiency - Plan: Vitamin D, Ergocalciferol, (DRISDOL) 50000 UNITS CAPS capsule  COPD GOLD II - Plan: Patient is currently on Advair, albuterol when necessary, patient currently denies smoking cigarettes albuterol (PROVENTIL  HFA;VENTOLIN HFA) 108 (90 BASE) MCG/ACT inhaler  Screening - Plan: COMPLETE METABOLIC PANEL WITH GFR, Lipid panel, CBC with Differential/Platelet, Hemoglobin A1c  Paresthesia of left foot - Plan:  gabapentin (NEURONTIN) 300 MG capsule, Vitamin B12   Health Maintenance - -Vaccinations:  Flu shot today  Return in about 3 months (around 12/31/2014), or if symptoms worsen or fail to improve.  Doris Cheadle, MD

## 2014-10-02 NOTE — Progress Notes (Signed)
Patient here for follow up Complains of having some numbing in his left foot Not sure if he has a heel spur

## 2014-10-03 LAB — HEMOGLOBIN A1C
Hgb A1c MFr Bld: 5.1 % (ref <5.7)
Mean Plasma Glucose: 100 mg/dL (ref <117)

## 2014-10-05 ENCOUNTER — Telehealth: Payer: Self-pay

## 2014-10-05 DIAGNOSIS — M549 Dorsalgia, unspecified: Principal | ICD-10-CM

## 2014-10-05 DIAGNOSIS — G8929 Other chronic pain: Secondary | ICD-10-CM

## 2014-10-05 MED ORDER — TRAMADOL HCL 50 MG PO TABS: 50.0000 mg | ORAL_TABLET | Freq: Three times a day (TID) | ORAL | Status: DC | PRN

## 2014-10-05 NOTE — Telephone Encounter (Signed)
Patient called requesting a refill on his tramadol Prescription printed and is available at the front desk 

## 2014-10-08 ENCOUNTER — Encounter: Payer: Self-pay | Admitting: *Deleted

## 2014-10-08 NOTE — Progress Notes (Signed)
Pt wife came in on behalf of her husband.  I gave her the lab results and she understood that he needed to be on b12 supplements and to avoid alcohol.

## 2014-10-11 ENCOUNTER — Telehealth: Payer: Self-pay

## 2014-10-11 NOTE — Telephone Encounter (Signed)
-----   Message from Doris Cheadleeepak Advani, MD sent at 10/03/2014  9:45 AM EST ----- Call and let the patient know that his vitamin B12 is in the low-normal range, advised patient to take over-the-counter cyanocobalamin 1000 mcg daily Also noted borderline elevated liver enzyme , advise patient to avoid alcohol, will repeat LFTs on the following visit.

## 2014-10-11 NOTE — Telephone Encounter (Signed)
Patient is aware of his lab results 

## 2014-11-05 ENCOUNTER — Telehealth: Payer: Self-pay

## 2014-11-05 DIAGNOSIS — G8929 Other chronic pain: Secondary | ICD-10-CM

## 2014-11-05 DIAGNOSIS — M549 Dorsalgia, unspecified: Principal | ICD-10-CM

## 2014-11-05 MED ORDER — TRAMADOL HCL 50 MG PO TABS: 50.0000 mg | ORAL_TABLET | Freq: Three times a day (TID) | ORAL | Status: DC | PRN

## 2014-11-05 NOTE — Telephone Encounter (Signed)
Patient called requesting a refill on his tramadol Prescription printed and is at the front desk

## 2014-11-28 ENCOUNTER — Ambulatory Visit: Payer: Self-pay | Attending: Internal Medicine | Admitting: Internal Medicine

## 2014-11-28 ENCOUNTER — Encounter: Payer: Self-pay | Admitting: Internal Medicine

## 2014-11-28 VITALS — BP 130/80 | HR 103 | Temp 97.4°F | Resp 17 | Wt 212.4 lb

## 2014-11-28 DIAGNOSIS — Z79899 Other long term (current) drug therapy: Secondary | ICD-10-CM | POA: Insufficient documentation

## 2014-11-28 DIAGNOSIS — S301XXA Contusion of abdominal wall, initial encounter: Secondary | ICD-10-CM | POA: Insufficient documentation

## 2014-11-28 DIAGNOSIS — J449 Chronic obstructive pulmonary disease, unspecified: Secondary | ICD-10-CM | POA: Insufficient documentation

## 2014-11-28 DIAGNOSIS — S40021A Contusion of right upper arm, initial encounter: Secondary | ICD-10-CM | POA: Insufficient documentation

## 2014-11-28 DIAGNOSIS — T148 Other injury of unspecified body region: Secondary | ICD-10-CM

## 2014-11-28 DIAGNOSIS — R0781 Pleurodynia: Secondary | ICD-10-CM | POA: Insufficient documentation

## 2014-11-28 DIAGNOSIS — T148XXA Other injury of unspecified body region, initial encounter: Secondary | ICD-10-CM

## 2014-11-28 DIAGNOSIS — W010XXA Fall on same level from slipping, tripping and stumbling without subsequent striking against object, initial encounter: Secondary | ICD-10-CM | POA: Insufficient documentation

## 2014-11-28 DIAGNOSIS — Z87891 Personal history of nicotine dependence: Secondary | ICD-10-CM | POA: Insufficient documentation

## 2014-11-28 DIAGNOSIS — R202 Paresthesia of skin: Secondary | ICD-10-CM

## 2014-11-28 MED ORDER — GABAPENTIN 400 MG PO CAPS: 400.0000 mg | ORAL_CAPSULE | Freq: Three times a day (TID) | ORAL | Status: AC

## 2014-11-28 NOTE — Progress Notes (Signed)
Patient states he fell this past Thursday and injured his right side Has pain to his right side-rib area and a huge bruise to his right arm Patient thinks he may have cracked a couple of ribs

## 2014-11-28 NOTE — Progress Notes (Signed)
MRN: [id number] Name: Jordan Bradshaw  Sex: male Age: 54 y.o. DOB: [birth year]  Allergies: Review of patient's allergies indicates no known allergies.  Chief Complaint  Patient presents with  . Fall    HPI: Patient is 54 y.o. male who has history of COPD chronic back pain, comes today complaining of right-sided rib pain, as per patient he tripped and fell on the right side 4 to 5 days ago, since then has been having some pain on the right side of chest/rib, also has a bruise on lower abdomen as well as on the right upper arm, patient has been taking his pain medication, denies any fever chills denies any cough has been using his inhalers.  Past Medical History  Diagnosis Date  . Asthma   . COPD (chronic obstructive pulmonary disease)     Past Surgical History  Procedure Laterality Date  . Eye surgery        Medication List       This list is accurate as of: 11/28/14  3:39 PM.  Always use your most recent med list.               albuterol (2.5 MG/3ML) 0.083% nebulizer solution  Commonly known as:  PROVENTIL  Take 3 mLs (2.5 mg total) by nebulization every 6 (six) hours as needed for wheezing or shortness of breath.     albuterol 108 (90 BASE) MCG/ACT inhaler  Commonly known as:  PROVENTIL HFA;VENTOLIN HFA  Inhale 2 puffs into the lungs every 4 (four) hours as needed for wheezing or shortness of breath.     amoxicillin-clavulanate 875-125 MG per tablet  Commonly known as:  AUGMENTIN  Take 1 tablet by mouth 2 (two) times daily.     benzonatate 100 MG capsule  Commonly known as:  TESSALON  Take 1 capsule (100 mg total) by mouth 3 (three) times daily as needed for cough.     cyclobenzaprine 10 MG tablet  Commonly known as:  FLEXERIL  Take 1 tablet (10 mg total) by mouth 3 (three) times daily.     fluticasone 50 MCG/ACT nasal spray  Commonly known as:  FLONASE  Place 2 sprays into both nostrils daily.     Fluticasone-Salmeterol 250-50 MCG/DOSE Aepb  Commonly  known as:  ADVAIR  Inhale 1 puff into the lungs every 12 (twelve) hours.     gabapentin 400 MG capsule  Commonly known as:  NEURONTIN  Take 1 capsule (400 mg total) by mouth 3 (three) times daily.     ibuprofen 600 MG tablet  Commonly known as:  ADVIL,MOTRIN  Take 1 tablet (600 mg total) by mouth every 8 (eight) hours as needed for moderate pain.     meloxicam 15 MG tablet  Commonly known as:  MOBIC  Take 1 tablet (15 mg total) by mouth daily.     methylPREDNISolone 4 MG tablet  Commonly known as:  MEDROL DOSEPAK  follow package directions     tamsulosin 0.4 MG Caps capsule  Commonly known as:  FLOMAX  Take 1 capsule (0.4 mg total) by mouth daily.     traMADol 50 MG tablet  Commonly known as:  ULTRAM  Take 1 tablet (50 mg total) by mouth every 8 (eight) hours as needed.     Vitamin D (Ergocalciferol) 50000 UNITS Caps capsule  Commonly known as:  DRISDOL  Take 1 capsule (50,000 Units total) by mouth every 7 (seven) days.        Meds ordered this  encounter  Medications  . gabapentin (NEURONTIN) 400 MG capsule    Sig: Take 1 capsule (400 mg total) by mouth 3 (three) times daily.    Dispense:  90 capsule    Refill:  3     There is no immunization history on file for this patient.  Family History  Problem Relation Age of Onset  . Lung cancer Sister     smoked  . Ovarian cancer Mother     History  Substance Use Topics  . Smoking status: Former Smoker -- 0.50 packs/day for 35 years    Types: Cigarettes  . Smokeless tobacco: Former Neurosurgeon    Quit date: 08/31/2013  . Alcohol Use: No    Review of Systems   As noted in HPI  Filed Vitals:   11/28/14 1503  BP: 130/80  Pulse:   Temp:   Resp:     Physical Exam  Physical Exam  Cardiovascular: Normal rate and regular rhythm.   Pulmonary/Chest: Breath sounds normal. No respiratory distress. He has no wheezes. He has no rales.  Tenderness on the right sided rib area  Abdominal:     Skin:  Right lower  abdominal/sidewall area large bruise The bruise is also noted on his right upper arm.     CBC    Component Value Date/Time   WBC 6.6 10/02/2014 1232   RBC 4.49 10/02/2014 1232   HGB 15.8 10/02/2014 1232   HCT 44.7 10/02/2014 1232   PLT 258 10/02/2014 1232   MCV 99.6 10/02/2014 1232   LYMPHSABS 3.5 10/02/2014 1232   MONOABS 0.5 10/02/2014 1232   EOSABS 0.3 10/02/2014 1232   BASOSABS 0.1 10/02/2014 1232    CMP     Component Value Date/Time   NA 140 10/02/2014 1232   K 5.2 10/02/2014 1232   CL 107 10/02/2014 1232   CO2 23 10/02/2014 1232   GLUCOSE 76 10/02/2014 1232   BUN 4* 10/02/2014 1232   CREATININE 0.80 10/02/2014 1232   CALCIUM 9.8 10/02/2014 1232   PROT 6.8 10/02/2014 1232   ALBUMIN 3.8 10/02/2014 1232   AST 60* 10/02/2014 1232   ALT 32 10/02/2014 1232   ALKPHOS 61 10/02/2014 1232   BILITOT 0.8 10/02/2014 1232   GFRNONAA >89 10/02/2014 1232   GFRAA >89 10/02/2014 1232    Lab Results  Component Value Date/Time   CHOL 143 10/02/2014 12:32 PM    Lab Results  Component Value Date/Time   HGBA1C 5.1 10/02/2014 12:32 PM    Lab Results  Component Value Date/Time   AST 60* 10/02/2014 12:32 PM    Assessment and Plan  Rib pain on right side - Plan: DG Ribs Unilateral Right  Bruise Patient is given information regarding bruise to RICE.  Have increased the dose of  gabapentin (NEURONTIN) 400 MG capsule, patient will take tramadol when necessary    Return in about 3 months (around 02/27/2015), or if symptoms worsen or fail to improve.   This note has been created with Education officer, environmental. Any transcriptional errors are unintentional.    Doris Cheadle, MD

## 2014-11-28 NOTE — Patient Instructions (Signed)
Contusion °A contusion is a deep bruise. Contusions are the result of an injury that caused bleeding under the skin. The contusion may turn blue, purple, or yellow. Minor injuries will give you a painless contusion, but more severe contusions may stay painful and swollen for a few weeks.  °CAUSES  °A contusion is usually caused by a blow, trauma, or direct force to an area of the body. °SYMPTOMS  °· Swelling and redness of the injured area. °· Bruising of the injured area. °· Tenderness and soreness of the injured area. °· Pain. °DIAGNOSIS  °The diagnosis can be made by taking a history and physical exam. An X-ray, CT scan, or MRI may be needed to determine if there were any associated injuries, such as fractures. °TREATMENT  °Specific treatment will depend on what area of the body was injured. In general, the best treatment for a contusion is resting, icing, elevating, and applying cold compresses to the injured area. Over-the-counter medicines may also be recommended for pain control. Ask your caregiver what the best treatment is for your contusion. °HOME CARE INSTRUCTIONS  °· Put ice on the injured area. °¨ Put ice in a plastic bag. °¨ Place a towel between your skin and the bag. °¨ Leave the ice on for 15-20 minutes, 3-4 times a day, or as directed by your health care provider. °· Only take over-the-counter or prescription medicines for pain, discomfort, or fever as directed by your caregiver. Your caregiver may recommend avoiding anti-inflammatory medicines (aspirin, ibuprofen, and naproxen) for 48 hours because these medicines may increase bruising. °· Rest the injured area. °· If possible, elevate the injured area to reduce swelling. °SEEK IMMEDIATE MEDICAL CARE IF:  °· You have increased bruising or swelling. °· You have pain that is getting worse. °· Your swelling or pain is not relieved with medicines. °MAKE SURE YOU:  °· Understand these instructions. °· Will watch your condition. °· Will get help right  away if you are not doing well or get worse. °Document Released: 05/13/2005 Document Revised: 08/08/2013 Document Reviewed: 06/08/2011 °[employer]® Patient Information ©2015 [employer]. This information is not intended to replace advice given to you by your health care provider. Make sure you discuss any questions you have with your health care provider. ° °

## 2014-12-01 ENCOUNTER — Ambulatory Visit (HOSPITAL_COMMUNITY)
Admission: RE | Admit: 2014-12-01 | Discharge: 2014-12-01 | Disposition: A | Discharge status: Home / Self Care | Payer: Self-pay | Source: Ambulatory Visit | Attending: Internal Medicine | Admitting: Internal Medicine

## 2014-12-01 DIAGNOSIS — W109XXA Fall (on) (from) unspecified stairs and steps, initial encounter: Secondary | ICD-10-CM | POA: Insufficient documentation

## 2014-12-01 DIAGNOSIS — R0781 Pleurodynia: Secondary | ICD-10-CM

## 2014-12-01 DIAGNOSIS — S2241XA Multiple fractures of ribs, right side, initial encounter for closed fracture: Secondary | ICD-10-CM | POA: Insufficient documentation

## 2014-12-01 DIAGNOSIS — J90 Pleural effusion, not elsewhere classified: Secondary | ICD-10-CM | POA: Insufficient documentation

## 2014-12-03 ENCOUNTER — Other Ambulatory Visit: Payer: Self-pay

## 2014-12-03 ENCOUNTER — Telehealth: Payer: Self-pay

## 2014-12-03 DIAGNOSIS — G8929 Other chronic pain: Secondary | ICD-10-CM

## 2014-12-03 DIAGNOSIS — M549 Dorsalgia, unspecified: Principal | ICD-10-CM

## 2014-12-03 MED ORDER — TRAMADOL HCL 50 MG PO TABS: 50.0000 mg | ORAL_TABLET | Freq: Three times a day (TID) | ORAL | Status: DC | PRN

## 2014-12-03 NOTE — Telephone Encounter (Signed)
-----   Message from Doris Cheadleeepak Advani, MD sent at 12/03/2014  1:12 PM EDT ----- Call and let the patient know that his x-ray reported.  IMPRESSION: Right eighth and ninth posterior rib fractures without pneumothorax. There is a small right pleural effusion.  Patient can take pain medication as prescribed, the x-ray does not report any complications currently, if there are any worsening symptoms advise patient to get immediate medical attention.

## 2014-12-03 NOTE — Telephone Encounter (Signed)
Patient called requesting refill  On his tramadol Prescription printed and at the front desk

## 2014-12-03 NOTE — Telephone Encounter (Signed)
Patient is aware of his x ray results 

## 2014-12-11 ENCOUNTER — Other Ambulatory Visit: Payer: Self-pay | Admitting: Internal Medicine

## 2014-12-12 ENCOUNTER — Telehealth: Payer: Self-pay

## 2014-12-12 NOTE — Telephone Encounter (Signed)
Patient called requesting refill on his flexeril Prescription sent to pharmacy on file

## 2015-01-02 ENCOUNTER — Other Ambulatory Visit: Payer: Self-pay

## 2015-01-02 ENCOUNTER — Telehealth: Payer: Self-pay

## 2015-01-02 DIAGNOSIS — G8929 Other chronic pain: Secondary | ICD-10-CM

## 2015-01-02 DIAGNOSIS — M549 Dorsalgia, unspecified: Principal | ICD-10-CM

## 2015-01-02 MED ORDER — TRAMADOL HCL 50 MG PO TABS: 50.0000 mg | ORAL_TABLET | Freq: Three times a day (TID) | ORAL | Status: DC | PRN

## 2015-01-02 NOTE — Telephone Encounter (Signed)
Patient is aware his prescription is ready to be picked up

## 2015-01-02 NOTE — Progress Notes (Unsigned)
Patient called requesting a refill on his tramadol Per Dr Orpah CobbAdvani we can refill Prescription printed and is at the front desk

## 2015-01-31 ENCOUNTER — Telehealth: Payer: Self-pay

## 2015-01-31 DIAGNOSIS — M549 Dorsalgia, unspecified: Principal | ICD-10-CM

## 2015-01-31 DIAGNOSIS — G8929 Other chronic pain: Secondary | ICD-10-CM

## 2015-01-31 MED ORDER — TRAMADOL HCL 50 MG PO TABS: 50.0000 mg | ORAL_TABLET | Freq: Three times a day (TID) | ORAL | Status: DC | PRN

## 2015-01-31 NOTE — Telephone Encounter (Signed)
Patient called requesting a refill on his tramadol Prescription printed and is ready for pick up

## 2015-01-31 NOTE — Telephone Encounter (Signed)
Returned patient phone call Patient is aware his prescription is at the front desk

## 2015-02-27 ENCOUNTER — Ambulatory Visit: Payer: Self-pay | Attending: Internal Medicine

## 2015-03-04 ENCOUNTER — Telehealth: Payer: Self-pay

## 2015-03-04 ENCOUNTER — Other Ambulatory Visit: Payer: Self-pay

## 2015-03-04 DIAGNOSIS — G8929 Other chronic pain: Secondary | ICD-10-CM

## 2015-03-04 DIAGNOSIS — M549 Dorsalgia, unspecified: Principal | ICD-10-CM

## 2015-03-04 MED ORDER — TRAMADOL HCL 50 MG PO TABS: 50.0000 mg | ORAL_TABLET | Freq: Three times a day (TID) | ORAL | Status: DC | PRN

## 2015-03-04 NOTE — Telephone Encounter (Signed)
Patient called requesting a refill on his tramadol Prescription printed and is available at the front desk

## 2015-03-06 ENCOUNTER — Ambulatory Visit: Payer: Self-pay | Attending: Internal Medicine

## 2015-03-12 ENCOUNTER — Encounter: Payer: Self-pay | Admitting: Internal Medicine

## 2015-03-12 ENCOUNTER — Ambulatory Visit: Payer: Self-pay | Attending: Internal Medicine | Admitting: Internal Medicine

## 2015-03-12 VITALS — BP 121/77 | HR 86 | Temp 98.0°F | Resp 16 | Wt 211.0 lb

## 2015-03-12 DIAGNOSIS — J449 Chronic obstructive pulmonary disease, unspecified: Secondary | ICD-10-CM | POA: Insufficient documentation

## 2015-03-12 DIAGNOSIS — K029 Dental caries, unspecified: Secondary | ICD-10-CM | POA: Insufficient documentation

## 2015-03-12 DIAGNOSIS — R945 Abnormal results of liver function studies: Secondary | ICD-10-CM

## 2015-03-12 DIAGNOSIS — Z79899 Other long term (current) drug therapy: Secondary | ICD-10-CM | POA: Insufficient documentation

## 2015-03-12 DIAGNOSIS — R7989 Other specified abnormal findings of blood chemistry: Secondary | ICD-10-CM | POA: Insufficient documentation

## 2015-03-12 DIAGNOSIS — K219 Gastro-esophageal reflux disease without esophagitis: Secondary | ICD-10-CM | POA: Insufficient documentation

## 2015-03-12 DIAGNOSIS — Z87891 Personal history of nicotine dependence: Secondary | ICD-10-CM | POA: Insufficient documentation

## 2015-03-12 MED ORDER — OMEPRAZOLE 20 MG PO CPDR: 20.0000 mg | DELAYED_RELEASE_CAPSULE | Freq: Every day | ORAL | Status: DC

## 2015-03-12 MED ORDER — TAMSULOSIN HCL 0.4 MG PO CAPS: 0.4000 mg | ORAL_CAPSULE | Freq: Every day | ORAL | Status: AC

## 2015-03-12 NOTE — Progress Notes (Signed)
Patient here for follow up on his COPD Patient is requesting steroids to help with his breathing Patient states he has not been using the advair because of the side effects\ Patient also requesting a referral to the dentist

## 2015-03-12 NOTE — Progress Notes (Signed)
MRN: [id number] Name: Jordan Bradshaw  Sex: male Age: 54 y.o. DOB: 21-Apr-1961  Allergies: Review of patient's allergies indicates no known allergies.  Chief Complaint  Patient presents with  . Follow-up    HPI: Patient is 54 y.o. male who has history of COPD comes today for followup her as per patient he has not been using his Advair consistently, has been using albuterol more frequently, as per patient he used to follow with the pulmonologist in the past and at that time he was advised to continue with Advair, as per patient he has already quit smoking for almost a year now has some baseline shortness of breath, patient is counseled to at least continue with her maintenance medication he needs to follow with his pulmonologist, today is also requesting referral to see a dentist, previous blood work reviewed with the patient noticed abnormal LFTs, patient denies any prior history of hepatitis, he does drink alcohol.patient is also complaining of reflux symptoms.  Past Medical History  Diagnosis Date  . Asthma   . COPD (chronic obstructive pulmonary disease)     Past Surgical History  Procedure Laterality Date  . Eye surgery        Medication List       This list is accurate as of: 03/12/15 10:41 AM.  Always use your most recent med list.               albuterol (2.5 MG/3ML) 0.083% nebulizer solution  Commonly known as:  PROVENTIL  Take 3 mLs (2.5 mg total) by nebulization every 6 (six) hours as needed for wheezing or shortness of breath.     albuterol 108 (90 BASE) MCG/ACT inhaler  Commonly known as:  PROVENTIL HFA;VENTOLIN HFA  Inhale 2 puffs into the lungs every 4 (four) hours as needed for wheezing or shortness of breath.     amoxicillin-clavulanate 875-125 MG per tablet  Commonly known as:  AUGMENTIN  Take 1 tablet by mouth 2 (two) times daily.     benzonatate 100 MG capsule  Commonly known as:  TESSALON  Take 1 capsule (100 mg total) by mouth 3 (three) times  daily as needed for cough.     cyclobenzaprine 10 MG tablet  Commonly known as:  FLEXERIL  TAKE ONE TABLET 3 TIMES A DAY     fluticasone 50 MCG/ACT nasal spray  Commonly known as:  FLONASE  Place 2 sprays into both nostrils daily.     Fluticasone-Salmeterol 250-50 MCG/DOSE Aepb  Commonly known as:  ADVAIR  Inhale 1 puff into the lungs every 12 (twelve) hours.     gabapentin 400 MG capsule  Commonly known as:  NEURONTIN  Take 1 capsule (400 mg total) by mouth 3 (three) times daily.     ibuprofen 600 MG tablet  Commonly known as:  ADVIL,MOTRIN  Take 1 tablet (600 mg total) by mouth every 8 (eight) hours as needed for moderate pain.     meloxicam 15 MG tablet  Commonly known as:  MOBIC  Take 1 tablet (15 mg total) by mouth daily.     methylPREDNISolone 4 MG tablet  Commonly known as:  MEDROL DOSEPAK  follow package directions     omeprazole 20 MG capsule  Commonly known as:  PRILOSEC  Take 1 capsule (20 mg total) by mouth daily.     tamsulosin 0.4 MG Caps capsule  Commonly known as:  FLOMAX  Take 1 capsule (0.4 mg total) by mouth daily.     traMADol  50 MG tablet  Commonly known as:  ULTRAM  Take 1 tablet (50 mg total) by mouth every 8 (eight) hours as needed.     Vitamin D (Ergocalciferol) 50000 UNITS Caps capsule  Commonly known as:  DRISDOL  Take 1 capsule (50,000 Units total) by mouth every 7 (seven) days.        Meds ordered this encounter  Medications  . tamsulosin (FLOMAX) 0.4 MG CAPS capsule    Sig: Take 1 capsule (0.4 mg total) by mouth daily.    Dispense:  30 capsule    Refill:  3  . omeprazole (PRILOSEC) 20 MG capsule    Sig: Take 1 capsule (20 mg total) by mouth daily.    Dispense:  30 capsule    Refill:  3     There is no immunization history on file for this patient.  Family History  Problem Relation Age of Onset  . Lung cancer Sister     smoked  . Ovarian cancer Mother     History  Substance Use Topics  . Smoking status: Former  Smoker -- 0.50 packs/day for 35 years    Types: Cigarettes  . Smokeless tobacco: Former Neurosurgeon    Quit date: 08/31/2013  . Alcohol Use: No    Review of Systems   As noted in HPI  Filed Vitals:   03/12/15 0949  BP: 121/77  Pulse: 86  Temp: 98 F (36.7 C)  Resp: 16    Physical Exam  Physical Exam  Constitutional: No distress.  Neck: Neck supple.  Cardiovascular: Normal rate and regular rhythm.   Pulmonary/Chest: No respiratory distress. He has no rales.  Minimal wheezing  Musculoskeletal: He exhibits no edema.    CBC    Component Value Date/Time   WBC 6.6 10/02/2014 1232   RBC 4.49 10/02/2014 1232   HGB 15.8 10/02/2014 1232   HCT 44.7 10/02/2014 1232   PLT 258 10/02/2014 1232   MCV 99.6 10/02/2014 1232   LYMPHSABS 3.5 10/02/2014 1232   MONOABS 0.5 10/02/2014 1232   EOSABS 0.3 10/02/2014 1232   BASOSABS 0.1 10/02/2014 1232    CMP     Component Value Date/Time   NA 140 10/02/2014 1232   K 5.2 10/02/2014 1232   CL 107 10/02/2014 1232   CO2 23 10/02/2014 1232   GLUCOSE 76 10/02/2014 1232   BUN 4* 10/02/2014 1232   CREATININE 0.80 10/02/2014 1232   CALCIUM 9.8 10/02/2014 1232   PROT 6.8 10/02/2014 1232   ALBUMIN 3.8 10/02/2014 1232   AST 60* 10/02/2014 1232   ALT 32 10/02/2014 1232   ALKPHOS 61 10/02/2014 1232   BILITOT 0.8 10/02/2014 1232   GFRNONAA >89 10/02/2014 1232   GFRAA >89 10/02/2014 1232    Lab Results  Component Value Date/Time   CHOL 143 10/02/2014 12:32 PM    Lab Results  Component Value Date/Time   HGBA1C 5.1 10/02/2014 12:32 PM    Lab Results  Component Value Date/Time   AST 60* 10/02/2014 12:32 PM    Assessment and Plan  COPD GOLD II - Plan:patient has already quit smoking, advise patient to continue with Advair, albuterol when necessary  Ambulatory referral to Pulmonology  Dental cavities - Plan: Ambulatory referral to Dentistry  Gastroesophageal reflux disease, esophagitis presence not specified - Plan: lifestyle  modification, trial of omeprazole (PRILOSEC) 20 MG capsule  Abnormal LFTs - Plan: Hepatitis B surface antibody, Hepatitis B surface antigen, Hepatitis C RNA quantitative   Return in about 3 months (  around 06/12/2015), or if symptoms worsen or fail to improve.   This note has been created with Education officer, environmental. Any transcriptional errors are unintentional.    Doris Cheadle, MD

## 2015-03-13 LAB — HEPATITIS B SURFACE ANTIGEN: HEP B S AG: NEGATIVE

## 2015-03-13 LAB — HEPATITIS B SURFACE ANTIBODY,QUALITATIVE: Hep B S Ab: NEGATIVE

## 2015-03-14 ENCOUNTER — Telehealth: Payer: Self-pay

## 2015-03-14 DIAGNOSIS — B1920 Unspecified viral hepatitis C without hepatic coma: Secondary | ICD-10-CM

## 2015-03-14 LAB — HEPATITIS C RNA QUANTITATIVE
HCV QUANT LOG: 7.01 {Log} — AB (ref <1.18)
HCV Quantitative: 10163510 IU/mL — ABNORMAL HIGH (ref <15)

## 2015-03-14 NOTE — Telephone Encounter (Signed)
-----   Message from Doris Cheadle, MD sent at 03/14/2015 12:08 PM EDT ----- Call and let the patient know that his test for hepatitis C is positive, patient needs to see the ID specialist, put in the referral.

## 2015-03-14 NOTE — Telephone Encounter (Signed)
Patient is aware of his lab results Referral for ID placed in epic

## 2015-03-26 ENCOUNTER — Other Ambulatory Visit: Payer: Self-pay

## 2015-04-02 ENCOUNTER — Telehealth: Payer: Self-pay

## 2015-04-02 DIAGNOSIS — M549 Dorsalgia, unspecified: Principal | ICD-10-CM

## 2015-04-02 DIAGNOSIS — G8929 Other chronic pain: Secondary | ICD-10-CM

## 2015-04-02 MED ORDER — TRAMADOL HCL 50 MG PO TABS: 50.0000 mg | ORAL_TABLET | Freq: Three times a day (TID) | ORAL | Status: DC | PRN

## 2015-04-02 NOTE — Telephone Encounter (Signed)
Patient called requesting a refill on his tramadol Prescription printed and is at the front desk 

## 2015-04-10 ENCOUNTER — Telehealth: Payer: Self-pay | Admitting: Lab

## 2015-04-10 NOTE — Telephone Encounter (Signed)
Patient had appointmentt to get labs drawn to see Dr Luciana Axe on 03/26/15-he no showed.  Called him today-he said his car broke down and now he has no way to get here.  He said he would call me in a couple of weeks.  He apologized for not calling me to let me know

## 2015-04-29 NOTE — Telephone Encounter (Signed)
See updated referral notes on Referral

## 2015-04-30 ENCOUNTER — Other Ambulatory Visit: Payer: Self-pay | Admitting: Internal Medicine

## 2015-05-03 ENCOUNTER — Telehealth: Payer: Self-pay

## 2015-05-03 DIAGNOSIS — G8929 Other chronic pain: Secondary | ICD-10-CM

## 2015-05-03 DIAGNOSIS — M549 Dorsalgia, unspecified: Principal | ICD-10-CM

## 2015-05-03 MED ORDER — TRAMADOL HCL 50 MG PO TABS: 50.0000 mg | ORAL_TABLET | Freq: Three times a day (TID) | ORAL | Status: DC | PRN

## 2015-05-03 NOTE — Telephone Encounter (Signed)
Patient called requesting med refill on traMADol (ULTRAM) 50 MG tablet. Please f/u

## 2015-05-03 NOTE — Telephone Encounter (Signed)
Patient called requesting a refill on his tramadol Can we refill this thanks

## 2015-05-03 NOTE — Telephone Encounter (Signed)
Yes, 90 with 2 refills written and ready for pick up

## 2015-05-07 ENCOUNTER — Other Ambulatory Visit: Payer: Self-pay

## 2015-05-07 ENCOUNTER — Telehealth: Payer: Self-pay | Admitting: *Deleted

## 2015-05-07 DIAGNOSIS — B1710 Acute hepatitis C without hepatic coma: Secondary | ICD-10-CM

## 2015-05-07 LAB — CBC WITH DIFFERENTIAL/PLATELET
BASOS ABS: 0 10*3/uL (ref 0.0–0.1)
Basophils Relative: 1 % (ref 0–1)
EOS ABS: 0.2 10*3/uL (ref 0.0–0.7)
Eosinophils Relative: 4 % (ref 0–5)
HEMATOCRIT: 40.2 % (ref 39.0–52.0)
Hemoglobin: 14.3 g/dL (ref 13.0–17.0)
LYMPHS ABS: 1.6 10*3/uL (ref 0.7–4.0)
LYMPHS PCT: 39 % (ref 12–46)
MCH: 35.9 pg — AB (ref 26.0–34.0)
MCHC: 35.6 g/dL (ref 30.0–36.0)
MCV: 101 fL — ABNORMAL HIGH (ref 78.0–100.0)
MONOS PCT: 10 % (ref 3–12)
MPV: 10.1 fL (ref 8.6–12.4)
Monocytes Absolute: 0.4 10*3/uL (ref 0.1–1.0)
NEUTROS ABS: 1.9 10*3/uL (ref 1.7–7.7)
NEUTROS PCT: 46 % (ref 43–77)
PLATELETS: 145 10*3/uL — AB (ref 150–400)
RBC: 3.98 MIL/uL — ABNORMAL LOW (ref 4.22–5.81)
RDW: 14.1 % (ref 11.5–15.5)
WBC: 4.1 10*3/uL (ref 4.0–10.5)

## 2015-05-07 LAB — COMPREHENSIVE METABOLIC PANEL
ALK PHOS: 78 U/L (ref 40–115)
ALT: 120 U/L — AB (ref 9–46)
AST: 185 U/L — ABNORMAL HIGH (ref 10–35)
Albumin: 3.9 g/dL (ref 3.6–5.1)
BUN: 3 mg/dL — AB (ref 7–25)
CO2: 26 mmol/L (ref 20–31)
Calcium: 9.4 mg/dL (ref 8.6–10.3)
Chloride: 100 mmol/L (ref 98–110)
Creat: 0.77 mg/dL (ref 0.70–1.33)
Glucose, Bld: 137 mg/dL — ABNORMAL HIGH (ref 65–99)
Potassium: 4.3 mmol/L (ref 3.5–5.3)
Sodium: 137 mmol/L (ref 135–146)
Total Bilirubin: 2.2 mg/dL — ABNORMAL HIGH (ref 0.2–1.2)
Total Protein: 7 g/dL (ref 6.1–8.1)

## 2015-05-07 LAB — HEPATITIS B SURFACE ANTIBODY,QUALITATIVE: Hep B S Ab: NEGATIVE

## 2015-05-07 LAB — IRON: Iron: 122 ug/dL (ref 50–180)

## 2015-05-07 LAB — HEPATITIS A ANTIBODY, TOTAL: Hep A Total Ab: REACTIVE — AB

## 2015-05-07 NOTE — Telephone Encounter (Signed)
Sounds fine thanks.

## 2015-05-07 NOTE — Telephone Encounter (Signed)
RN received call from [facility] asking for assistance. Patient had bloodwork drawn, then began to sweat profusely and pant.  When RN arrived, patient had an open coke next to him, was panting, pale in color, sweaty and clammy.  RN asked if the patient was diabetic, he stated "no but hadn't eaten yet today". He stated he wasn't feeling well over the weekend, his back hurt, and he recently had "a COPD episode." Vitals 1140am: blood pressure = 117/77, pulse = 76, O2 = 93%.   RN asked for a blood sugar, but the machine was malfunctioning.  RN assisted patient to a wheelchair, brought him to an exam room to lie down.  Patient immediately began to feel better.  His color returned, he stopped sweating, his breathing returned to normal rate.  Vitals 1147 am: 117/83, pulse 62, respirations 16.  RN offered snacks, the patient declined.  RN advised the patient to lie down a few more minutes, he did.  Patient slowly changed position, was able to stand without dizziness.  His color, pulse, and respirations remained steady.  Patient asked to leave, stating he "felt normal, completely ok again."  RN walked patient through the clinic, his gait was even.  Patient declined a snack, kept his coke, stated he "felt fine to go home."  Patient stated he would drive himself home, felt "completely fine" to do so.  RN advised him to eat before labs next time (if appropriate) and to ask to lie down for future lab draws.  He verbalized understanding, agreement.  Patient left [facility] at 11:55am. Andree Coss, RN

## 2015-05-08 LAB — HIV ANTIBODY (ROUTINE TESTING W REFLEX): HIV: NONREACTIVE

## 2015-05-08 LAB — PROTIME-INR
INR: 1.04 (ref <1.50)
Prothrombin Time: 13.7 seconds (ref 11.6–15.2)

## 2015-05-08 LAB — ANA: Anti Nuclear Antibody(ANA): NEGATIVE

## 2015-05-09 LAB — HEPATITIS C GENOTYPE

## 2015-06-03 ENCOUNTER — Telehealth: Payer: Self-pay

## 2015-06-03 DIAGNOSIS — M549 Dorsalgia, unspecified: Principal | ICD-10-CM

## 2015-06-03 DIAGNOSIS — G8929 Other chronic pain: Secondary | ICD-10-CM

## 2015-06-03 MED ORDER — TRAMADOL HCL 50 MG PO TABS: 50.0000 mg | ORAL_TABLET | Freq: Three times a day (TID) | ORAL | Status: DC | PRN

## 2015-06-03 NOTE — Telephone Encounter (Signed)
Patient called again requesting a refill on his tramadol Prescription printed and is at the front desk

## 2015-06-03 NOTE — Telephone Encounter (Signed)
Patient called to check on the status of his Tramadol refill, please f/u

## 2015-06-03 NOTE — Telephone Encounter (Signed)
Patient called requesting a refill on his tramadol Can you refill this for me

## 2015-06-05 NOTE — Telephone Encounter (Signed)
Tramadol rx refilled by Dr. Hyman HopesJegede on 06/03/15

## 2015-06-05 NOTE — Telephone Encounter (Signed)
Called patient. Patient verified name and date of birth. Patient notified that his tramadol has been refilled and he can pick up the rx at the front desk. Patient voiced understanding.

## 2015-07-02 ENCOUNTER — Telehealth: Payer: Self-pay

## 2015-07-02 DIAGNOSIS — M549 Dorsalgia, unspecified: Principal | ICD-10-CM

## 2015-07-02 DIAGNOSIS — G8929 Other chronic pain: Secondary | ICD-10-CM

## 2015-07-02 MED ORDER — CYCLOBENZAPRINE HCL 10 MG PO TABS: ORAL_TABLET | ORAL | Status: DC

## 2015-07-02 NOTE — Telephone Encounter (Signed)
Patient called requesting a refill on his tramadol Can this be refilled thanks

## 2015-07-03 ENCOUNTER — Encounter: Payer: Self-pay | Admitting: Internal Medicine

## 2015-07-03 ENCOUNTER — Ambulatory Visit (INDEPENDENT_AMBULATORY_CARE_PROVIDER_SITE_OTHER): Payer: Self-pay | Admitting: Internal Medicine

## 2015-07-03 VITALS — BP 152/93 | HR 88 | Temp 97.3°F | Ht 73.0 in | Wt 216.0 lb

## 2015-07-03 DIAGNOSIS — Z23 Encounter for immunization: Secondary | ICD-10-CM

## 2015-07-03 DIAGNOSIS — B182 Chronic viral hepatitis C: Secondary | ICD-10-CM

## 2015-07-03 NOTE — Progress Notes (Addendum)
Patient ID: [name], male   DOB: 08-05-1961, 54 y.o.   MRN: [id number]    [facility] Infectious Disease   CC: consideration for treatment for chronic hepatitis C  HPI:  +Jordan Bradshaw is a 54 y.o. male who presents for initial evaluation and management of chronic hepatitis C.  Patient tested positive earlier this year. Hepatitis C-associated risk factors present are: IV drug abuse (details: over 10 years ago). Patient denies history of blood transfusion, renal dialysis, sexual contact with person with liver disease, tattoos. Patient has had other studies performed. Results: hepatitis C RNA by PCR, result: positive. Patient has not had prior treatment for Hepatitis C. Patient does not have a past history of liver disease. Patient does have a family history of liver disease. Patient does not  have associated signs or symptoms related to liver disease.  Labs reviewed and confirm chronic hepatitis C with a positive viral load.   Records reviewed from [facility].  Chronic pain, on tramadol.   His brother died recently of complications of cirrhosis.  He struggles with alcohol abuse and is tearful during exam.     Patient does have documented immunity to Hepatitis A. Patient does not have documented immunity to Hepatitis B.    Review of Systems:  Constitutional: negative for fatigue and malaise Gastrointestinal: negative for diarrhea All other systems reviewed and are negative      Past Medical History  Diagnosis Date  . Asthma   . COPD (chronic obstructive pulmonary disease) (HCC)     Prior to Admission medications   Medication Sig Start Date End Date Taking? Authorizing Provider  albuterol (PROVENTIL HFA;VENTOLIN HFA) 108 (90 BASE) MCG/ACT inhaler Inhale 2 puffs into the lungs every 4 (four) hours as needed for wheezing or shortness of breath. 10/02/14  Yes Doris Cheadle, MD  albuterol (PROVENTIL) (2.5 MG/3ML) 0.083% nebulizer solution Take 3 mLs (2.5 mg total) by  nebulization every 6 (six) hours as needed for wheezing or shortness of breath. 06/20/13  Yes Shanker Levora Dredge, MD  cyclobenzaprine (FLEXERIL) 10 MG tablet TAKE ONE TABLET 3 TIMES A DAY 07/02/15  Yes Olugbemiga E Hyman Hopes, MD  fluticasone (FLONASE) 50 MCG/ACT nasal spray Place 2 sprays into both nostrils daily. 06/15/14  Yes Deepak Advani, MD  Fluticasone-Salmeterol (ADVAIR) 250-50 MCG/DOSE AEPB Inhale 1 puff into the lungs every 12 (twelve) hours. 09/13/13  Yes Quentin Angst, MD  gabapentin (NEURONTIN) 400 MG capsule Take 1 capsule (400 mg total) by mouth 3 (three) times daily. 11/28/14  Yes Doris Cheadle, MD  ibuprofen (ADVIL,MOTRIN) 600 MG tablet Take 1 tablet (600 mg total) by mouth every 8 (eight) hours as needed for moderate pain. 06/15/14  Yes Doris Cheadle, MD  meloxicam (MOBIC) 15 MG tablet Take 1 tablet (15 mg total) by mouth daily. 07/31/13  Yes Ripudeep Jenna Luo, MD  omeprazole (PRILOSEC) 20 MG capsule Take 1 capsule (20 mg total) by mouth daily. 03/12/15  Yes Doris Cheadle, MD  tamsulosin (FLOMAX) 0.4 MG CAPS capsule Take 1 capsule (0.4 mg total) by mouth daily. 03/12/15  Yes Doris Cheadle, MD  traMADol (ULTRAM) 50 MG tablet Take 1 tablet (50 mg total) by mouth every 8 (eight) hours as needed. 06/03/15  Yes Quentin Angst, MD    No Known Allergies  Social History  Substance Use Topics  . Smoking status: Current Every Day Smoker -- 0.50 packs/day for 35 years    Types: Cigarettes  . Smokeless tobacco: Current User  . Alcohol Use: 25.2  oz/week    42 Standard drinks or equivalent per week     Comment: six pack a day of beer    Family History  Problem Relation Age of Onset  . Lung cancer Sister     smoked  . Ovarian cancer Mother       Objective:  Constitutional: in no apparent distress, tearful Filed Vitals:   07/03/15 1409  BP: 152/93  Pulse: 88  Temp: 97.3 F (36.3 C)   Eyes: anicteric Cardiovascular: Cor RRR and No murmurs Respiratory: clear, normal  respiratory effort Gastrointestinal: Bowel sounds are normal, liver is not enlarged, spleen is not enlarged Musculoskeletal: peripheral pulses normal, no pedal edema, no clubbing or cyanosis Skin: negative for - jaundice, spider hemangioma, telangiectasia, palmar erythema, ecchymosis and atrophy; no porphyria cutanea tarda Lymphatic: no cervical lymphadenopathy   Laboratory Genotype:  Lab Results  Component Value Date   HCVGENOTYPE 1a 05/07/2015   HCV viral load:  Lab Results  Component Value Date   HCVQUANT [id number]* 03/12/2015   Lab Results  Component Value Date   WBC 4.1 05/07/2015   HGB 14.3 05/07/2015   HCT 40.2 05/07/2015   MCV 101.0* 05/07/2015   PLT 145* 05/07/2015    Lab Results  Component Value Date   CREATININE 0.77 05/07/2015   BUN 3* 05/07/2015   NA 137 05/07/2015   K 4.3 05/07/2015   CL 100 05/07/2015   CO2 26 05/07/2015    Lab Results  Component Value Date   ALT 120* 05/07/2015   AST 185* 05/07/2015   ALKPHOS 78 05/07/2015     Labs and history reviewed and show CHILD-PUGH A  5-6 points: Child class A 7-9 points: Child class B 10-15 points: Child class C  Lab Results  Component Value Date   INR 1.04 05/07/2015   BILITOT 2.2* 05/07/2015   ALBUMIN 3.9 05/07/2015     Assessment: New Patient with Chronic Hepatitis C genotype 1a, untreated.  I discussed with the patient the lab findings that confirm chronic hepatitis C as well as the natural history and progression of disease including about 30% of people who develop cirrhosis of the liver if left untreated and once cirrhosis is established there is a 2-7% risk per year of liver cancer and liver failure.  I discussed the importance of treatment and benefits in reducing the risk, even if significant liver fibrosis exists.   Plan: 1) Patient counseled extensively on limiting acetaminophen to no more than 2 grams daily, avoidance of alcohol. 2) Transmission discussed with patient including sexual  transmission, sharing razors and toothbrush.   3) Will need referral to gastroenterology if concern for cirrhosis 4) Will need referral for substance abuse counseling: Yes.  ; Further work up to include urine drug screen  No. He struggles with alcohol abuse and he will see our SA counselor to help him become alcohol free.  He is motivated and understands the importance.  This will not though keep him from treatment. 5) Will prescribe Zepatier for 12 weeks or 16 weeks with ribavirin if any NS5A resistance found 6) Hepatitis A vaccine No. 7) Hepatitis B vaccine Yes.   8) Pneumovax vaccine  9) Further work up to include liver staging with elastography 10) NS5A test  Yes.   10) will follow up after elastography

## 2015-07-03 NOTE — Patient Instructions (Signed)
Date 07/03/2015  Dear Mr. Jordan Bradshaw, As discussed in the ID Clinic, your hepatitis C therapy will include the following medications:          Zepatier (elbasvir 50 mg/grazoprevir 100 mg) for 12 weeks              OR      16 weeks with ribavirin in certain cases   Please note that ALL MEDICATIONS WILL START ON THE SAME DATE for a total of 12 weeks. ---------------------------------------------------------------- Your HCV Treatment Start Date: TBA   Your HCV genotype:  1a    Liver Fibrosis: TBD    ---------------------------------------------------------------- YOUR Bradshaw CONTACT:   Jordan Bradshaw [facility] [address] Phone: [phone] Hours: Monday to Friday 7:30 am to 6:00 pm   Please always contact your Bradshaw at least 3-4 business days before you run out of medications to ensure your next month's medication is ready or 1 week prior to running out if you receive it by mail.  Remember, each prescription is for 28 days. ---------------------------------------------------------------- GENERAL NOTES REGARDING YOUR HEPATITIS C MEDICATION:  ZEPATIER is available as a beige-colored, oval-shaped, film-coated tablet debossed with "770" on one side and plain on the other. Each tablet contains 50 mg elbasvir and 100 mg grazoprevir.  Common side effects of ZEPATIER when used without ribavirin include: - feeling tired -trouble sleeping - headache -diarrhea - nausea  Common side effects of ZEPATIER when used with ribavirin include: - low red blood cell counts (anemia) - feeling irritable - headache - stomach pain - feeling tired - depression - shortness of breath - joint pain - rash or itching  Please note that this only lists the most common side effects and is NOT a comprehensive list of the potential side effects of these medications. For more information, please review the drug information sheets that come with your  medication package from the Bradshaw.  ---------------------------------------------------------------- GENERAL HELPFUL HINTS ON HCV THERAPY: 1. Stay well-hydrated. 2. Notify the ID Clinic of any changes in your other over-the-counter/herbal or prescription medications. 3. If you miss a dose of your medication, take the missed dose as soon as you remember. Return to your regular time/dose schedule the next day.  4.  Do not stop taking your medications without first talking with your healthcare provider. 5.  You may take Tylenol (acetaminophen), as long as the dose is less than 2000 mg (OR no more than 4 tablets of the Tylenol Extra Strengths 500mg  tablet) in 24 hours. 6.  You will see our pharmacist-specialist within the first 2 weeks of starting your medication. 7.  You will need to obtain routine labs around week 4 and12 weeks after starting and then 3 to 6 months after finishing Zepatier.   8.  If ribavirin is part of your regimen, you also will have a lab visit within 2 weeks.   Staci RighterOMER, Kia Varnadore, MD  [facility] [address] [place], [place]  [id number] [phone]

## 2015-07-03 NOTE — Addendum Note (Signed)
Addended by: Wendall MolaOCKERHAM, Janet Decesare A on: 07/03/2015 03:03 PM   Modules accepted: Orders

## 2015-07-05 MED ORDER — TRAMADOL HCL 50 MG PO TABS
50.0000 mg | ORAL_TABLET | Freq: Three times a day (TID) | ORAL | Status: DC | PRN
Start: 2015-07-05 — End: 2015-09-17

## 2015-07-05 NOTE — Telephone Encounter (Signed)
Tramadol refilled OV needed for additional refills

## 2015-07-05 NOTE — Telephone Encounter (Signed)
Pt notified Rx at front office  Advised to bring ID

## 2015-07-07 LAB — HCV RNA NS5A DRUG RESISTANCE

## 2015-07-17 ENCOUNTER — Other Ambulatory Visit: Payer: Self-pay | Admitting: Internal Medicine

## 2015-07-17 MED ORDER — ELBASVIR-GRAZOPREVIR 50-100 MG PO TABS: 1.0000 | ORAL_TABLET | Freq: Every day | ORAL | Status: DC

## 2015-07-18 ENCOUNTER — Encounter: Payer: Self-pay | Admitting: Pharmacy Technician

## 2015-07-19 ENCOUNTER — Ambulatory Visit: Payer: Self-pay | Admitting: *Deleted

## 2015-07-24 ENCOUNTER — Ambulatory Visit (HOSPITAL_COMMUNITY)
Admission: RE | Admit: 2015-07-24 | Discharge: 2015-07-24 | Disposition: A | Discharge status: Home / Self Care | Payer: Self-pay | Source: Ambulatory Visit | Attending: Internal Medicine | Admitting: Internal Medicine

## 2015-07-24 DIAGNOSIS — R932 Abnormal findings on diagnostic imaging of liver and biliary tract: Secondary | ICD-10-CM | POA: Insufficient documentation

## 2015-07-24 DIAGNOSIS — B182 Chronic viral hepatitis C: Secondary | ICD-10-CM | POA: Insufficient documentation

## 2015-07-24 DIAGNOSIS — K824 Cholesterolosis of gallbladder: Secondary | ICD-10-CM | POA: Insufficient documentation

## 2015-07-24 DIAGNOSIS — R7989 Other specified abnormal findings of blood chemistry: Secondary | ICD-10-CM | POA: Insufficient documentation

## 2015-08-21 ENCOUNTER — Ambulatory Visit: Payer: Self-pay | Admitting: Internal Medicine

## 2015-09-04 ENCOUNTER — Telehealth: Payer: Self-pay

## 2015-09-04 DIAGNOSIS — G8929 Other chronic pain: Secondary | ICD-10-CM

## 2015-09-04 DIAGNOSIS — K219 Gastro-esophageal reflux disease without esophagitis: Secondary | ICD-10-CM

## 2015-09-04 DIAGNOSIS — M549 Dorsalgia, unspecified: Secondary | ICD-10-CM

## 2015-09-04 MED ORDER — OMEPRAZOLE 20 MG PO CPDR: 20.0000 mg | DELAYED_RELEASE_CAPSULE | Freq: Every day | ORAL | Status: DC

## 2015-09-04 MED ORDER — CYCLOBENZAPRINE HCL 10 MG PO TABS: ORAL_TABLET | ORAL | Status: DC

## 2015-09-04 MED ORDER — IBUPROFEN 600 MG PO TABS: ORAL_TABLET | ORAL | Status: AC

## 2015-09-04 MED FILL — IBUPROFEN 600 MG TABLET: 600 | 10 days supply | Qty: 30 | Fill #0

## 2015-09-04 MED FILL — ?OMEPRAZOLE DR 20 MG CAPSUL: 20 | 30 days supply | Qty: 30 | Fill #0

## 2015-09-04 MED FILL — ?CYCLOBENZAPRINE 10 MG TABL: 10 | 30 days supply | Qty: 90 | Fill #0

## 2015-09-04 NOTE — Telephone Encounter (Signed)
Patient called requesting medication refills on his flexeril Iburprofen, albuteral, omeprazole and tramadol Patient was informed i can fill a thirty day supply but patient will need to make an appt Patient also requesting a refill for his tramadol Can this be refilled

## 2015-09-05 ENCOUNTER — Telehealth: Payer: Self-pay

## 2015-09-05 NOTE — Telephone Encounter (Signed)
Called patient this pm Patient is aware we will not be refilling his tramadol until he has an office visit Patient has not been seen since July

## 2015-09-05 NOTE — Telephone Encounter (Signed)
No tramadol OV needed prior to refill

## 2015-09-17 ENCOUNTER — Encounter: Payer: Self-pay | Admitting: Internal Medicine

## 2015-09-17 ENCOUNTER — Ambulatory Visit: Payer: Self-pay | Attending: Internal Medicine | Admitting: Internal Medicine

## 2015-09-17 VITALS — BP 146/91 | HR 104 | Temp 98.0°F | Resp 16 | Wt 219.0 lb

## 2015-09-17 DIAGNOSIS — R062 Wheezing: Secondary | ICD-10-CM

## 2015-09-17 DIAGNOSIS — K219 Gastro-esophageal reflux disease without esophagitis: Secondary | ICD-10-CM | POA: Insufficient documentation

## 2015-09-17 DIAGNOSIS — G8929 Other chronic pain: Secondary | ICD-10-CM | POA: Insufficient documentation

## 2015-09-17 DIAGNOSIS — Z79899 Other long term (current) drug therapy: Secondary | ICD-10-CM | POA: Insufficient documentation

## 2015-09-17 DIAGNOSIS — F17210 Nicotine dependence, cigarettes, uncomplicated: Secondary | ICD-10-CM | POA: Insufficient documentation

## 2015-09-17 DIAGNOSIS — M549 Dorsalgia, unspecified: Secondary | ICD-10-CM | POA: Insufficient documentation

## 2015-09-17 DIAGNOSIS — M4806 Spinal stenosis, lumbar region: Secondary | ICD-10-CM | POA: Insufficient documentation

## 2015-09-17 DIAGNOSIS — F17200 Nicotine dependence, unspecified, uncomplicated: Secondary | ICD-10-CM

## 2015-09-17 DIAGNOSIS — M1990 Unspecified osteoarthritis, unspecified site: Secondary | ICD-10-CM | POA: Insufficient documentation

## 2015-09-17 DIAGNOSIS — J449 Chronic obstructive pulmonary disease, unspecified: Secondary | ICD-10-CM | POA: Insufficient documentation

## 2015-09-17 DIAGNOSIS — J45909 Unspecified asthma, uncomplicated: Secondary | ICD-10-CM | POA: Insufficient documentation

## 2015-09-17 DIAGNOSIS — R05 Cough: Secondary | ICD-10-CM | POA: Insufficient documentation

## 2015-09-17 MED ORDER — ALBUTEROL SULFATE (2.5 MG/3ML) 0.083% IN NEBU
2.5000 mg | INHALATION_SOLUTION | Freq: Once | RESPIRATORY_TRACT | Status: AC
  Administered 2015-09-17: 2.5 mg via RESPIRATORY_TRACT

## 2015-09-17 MED ORDER — TRAMADOL HCL 50 MG PO TABS
50.0000 mg | ORAL_TABLET | Freq: Two times a day (BID) | ORAL | Status: DC | PRN
Start: 2015-09-17 — End: 2015-10-15

## 2015-09-17 MED ORDER — ALBUTEROL SULFATE HFA 108 (90 BASE) MCG/ACT IN AERS: 2.0000 | INHALATION_SPRAY | RESPIRATORY_TRACT | Status: DC | PRN

## 2015-09-17 MED ORDER — FLUTICASONE-SALMETEROL 250-50 MCG/DOSE IN AEPB: 1.0000 | INHALATION_SPRAY | Freq: Two times a day (BID) | RESPIRATORY_TRACT | Status: DC

## 2015-09-17 MED ORDER — MELOXICAM 15 MG PO TABS: 15.0000 mg | ORAL_TABLET | Freq: Every day | ORAL | Status: AC

## 2015-09-17 MED ORDER — ALBUTEROL SULFATE (2.5 MG/3ML) 0.083% IN NEBU: 2.5000 mg | INHALATION_SOLUTION | Freq: Four times a day (QID) | RESPIRATORY_TRACT | Status: AC | PRN

## 2015-09-17 MED FILL — VENTOLIN HFA 90 MCG INHALER: 108 (90 BAS | 25 days supply | Qty: 18 | Fill #0

## 2015-09-17 MED FILL — **ADVAIR 250/50 DISKUS: 250-50 MCG | 30 days supply | Qty: 60 | Fill #0

## 2015-09-17 MED FILL — MELOXICAM 15 MG TABLET: 15 | 30 days supply | Qty: 30 | Fill #0

## 2015-09-17 MED FILL — traMADol HCL 50 MG TABS: 50 | 30 days supply | Qty: 60 | Fill #0

## 2015-09-17 MED FILL — ?ALBUTEROL SUL 2.5 MG/3 MLS: (2.5 MG/3ML | 30 days supply | Qty: 90 | Fill #0

## 2015-09-17 NOTE — Progress Notes (Signed)
Patient here for follow up on his asthma and to establish care With  His new provider

## 2015-09-17 NOTE — Progress Notes (Signed)
Patient ID: Jordan Bradshaw, male   DOB: [birth year], 55 y.o.   MRN: [id number]  CC: asthma, COPD  HPI: Jordan Bradshaw is a 55 y.o. male here today for a follow up visit.  Patient has past medical history of COPD, asthma, and Hepatitis C. Patient does not have a pulmonologist currently. He uses Advair and albuterol. He states that he has been out of both inhalers for the past 3 weeks. During this time he has been having more difficulty with catching his breath with exertions. He reportedly smokes 5 cigarettes per day. He states that he is unable to ambulate through the house or go grocery shopping due to the SOB. Has cough as well.  Reports that he has joint pain and arthritis. He reports spinal stenosis in his lumbar spine and has been using Tramadol as needed. He would like a neurosurgeon referral.   No Known Allergies Past Medical History  Diagnosis Date  . Asthma   . COPD (chronic obstructive pulmonary disease) (HCC)    Current Outpatient Prescriptions on File Prior to Visit  Medication Sig Dispense Refill  . albuterol (PROVENTIL HFA;VENTOLIN HFA) 108 (90 BASE) MCG/ACT inhaler Inhale 2 puffs into the lungs every 4 (four) hours as needed for wheezing or shortness of breath. 3 Inhaler 4  . albuterol (PROVENTIL) (2.5 MG/3ML) 0.083% nebulizer solution Take 3 mLs (2.5 mg total) by nebulization every 6 (six) hours as needed for wheezing or shortness of breath. 150 mL 3  . cyclobenzaprine (FLEXERIL) 10 MG tablet TAKE ONE TABLET 3 TIMES A DAY 90 tablet 0  . Elbasvir-Grazoprevir (ZEPATIER) 50-100 MG TABS Take 1 tablet by mouth daily. 28 tablet 2  . Fluticasone-Salmeterol (ADVAIR) 250-50 MCG/DOSE AEPB Inhale 1 puff into the lungs every 12 (twelve) hours. 3 each 4  . ibuprofen (ADVIL,MOTRIN) 600 MG tablet TAKE ONE TABLET EVERY 8 HOURS AS NEEDED FOR MODERATE PAIN 30 tablet 3  . meloxicam (MOBIC) 15 MG tablet Take 1 tablet (15 mg total) by mouth daily. 30 tablet 3  . omeprazole (PRILOSEC) 20 MG capsule  Take 1 capsule (20 mg total) by mouth daily. 30 capsule 3  . tamsulosin (FLOMAX) 0.4 MG CAPS capsule Take 1 capsule (0.4 mg total) by mouth daily. 30 capsule 3  . traMADol (ULTRAM) 50 MG tablet Take 1 tablet (50 mg total) by mouth every 8 (eight) hours as needed. 90 tablet 0  . fluticasone (FLONASE) 50 MCG/ACT nasal spray Place 2 sprays into both nostrils daily. (Patient not taking: Reported on 09/17/2015) 16 g 6  . gabapentin (NEURONTIN) 400 MG capsule Take 1 capsule (400 mg total) by mouth 3 (three) times daily. (Patient not taking: Reported on 09/17/2015) 90 capsule 3   No current facility-administered medications on file prior to visit.   Family History  Problem Relation Age of Onset  . Lung cancer Sister     smoked  . Ovarian cancer Mother    Social History   Social History  . Marital Status: Single    Spouse Name: N/A  . Number of Children: N/A  . Years of Education: N/A   Occupational History  . Unemployed    Social History Main Topics  . Smoking status: Current Every Day Smoker -- 0.50 packs/day for 35 years    Types: Cigarettes  . Smokeless tobacco: Current User  . Alcohol Use: 25.2 oz/week    42 Standard drinks or equivalent per week     Comment: six pack a day of beer  . Drug Use: No  .  Sexual Activity: Not on file   Other Topics Concern  . Not on file   Social History Narrative    Review of Systems: Other than what is stated in HPI, all other systems are negative.  \ Objective:   Filed Vitals:   09/17/15 1442  BP: 146/91  Pulse: 104  Temp: 98 F (36.7 C)  Resp: 16    Physical Exam  Constitutional: He is oriented to person, place, and time.  Cardiovascular: Normal rate, regular rhythm and normal heart sounds.   Pulmonary/Chest: Effort normal. He has wheezes (bilateral lung bases).  Increased work of breathing with conversation  Neurological: He is alert and oriented to person, place, and time.  Skin: Skin is warm and dry.     Lab Results   Component Value Date   WBC 4.1 05/07/2015   HGB 14.3 05/07/2015   HCT 40.2 05/07/2015   MCV 101.0* 05/07/2015   PLT 145* 05/07/2015   Lab Results  Component Value Date   CREATININE 0.77 05/07/2015   BUN 3* 05/07/2015   NA 137 05/07/2015   K 4.3 05/07/2015   CL 100 05/07/2015   CO2 26 05/07/2015    Lab Results  Component Value Date   HGBA1C 5.1 10/02/2014   Lipid Panel     Component Value Date/Time   CHOL 143 10/02/2014 1232   TRIG 90 10/02/2014 1232   HDL 69 10/02/2014 1232   CHOLHDL 2.1 10/02/2014 1232   VLDL 18 10/02/2014 1232   LDLCALC 56 10/02/2014 1232       Assessment and plan:   Jordan Bradshaw was seen today for new patient (initial visit).  Diagnoses and all orders for this visit:  COPD GOLD II -     albuterol (PROVENTIL HFA;VENTOLIN HFA) 108 (90 Base) MCG/ACT inhaler; Inhale 2 puffs into the lungs every 4 (four) hours as needed for wheezing or shortness of breath. -     albuterol (PROVENTIL) (2.5 MG/3ML) 0.083% nebulizer solution; Take 3 mLs (2.5 mg total) by nebulization every 6 (six) hours as needed for wheezing or shortness of breath. -     Fluticasone-Salmeterol (ADVAIR) 250-50 MCG/DOSE AEPB; Inhale 1 puff into the lungs every 12 (twelve) hours. -     Ambulatory referral to Pulmonology  Wheezing -     albuterol (PROVENTIL) (2.5 MG/3ML) 0.083% nebulizer solution 2.5 mg; Take 3 mLs (2.5 mg total) by nebulization once in office  Gastroesophageal reflux disease, esophagitis presence not specified Stable, medication refilled  Chronic back pain -     traMADol (ULTRAM) 50 MG tablet; Take 1 tablet (50 mg total) by mouth every 12 (twelve) hours as needed for severe pain. -     meloxicam (MOBIC) 15 MG tablet; Take 1 tablet (15 mg total) by mouth daily.  Tobacco use Smoking cessation discussed for 3 minutes, patient is not willing to quit at this time. Will continue to assess on each visit. Discussed increased risk for diseases such as cancer, heart disease, and  stroke.  I stressed that his breathing will only get worse if he keeps smoking.       Return if symptoms worsen or fail to improve.   Ambrose Finland, NP-C [facility] [phone] 09/17/2015, 2:47 PM

## 2015-10-15 ENCOUNTER — Telehealth: Payer: Self-pay

## 2015-10-15 ENCOUNTER — Other Ambulatory Visit: Payer: Self-pay

## 2015-10-15 DIAGNOSIS — M549 Dorsalgia, unspecified: Principal | ICD-10-CM

## 2015-10-15 DIAGNOSIS — G8929 Other chronic pain: Secondary | ICD-10-CM

## 2015-10-15 MED ORDER — TRAMADOL HCL 50 MG PO TABS: 50.0000 mg | ORAL_TABLET | Freq: Two times a day (BID) | ORAL | Status: DC | PRN

## 2015-10-15 MED FILL — ?OMEPRAZOLE DR 20 MG CAPSUL: 20 | 30 days supply | Qty: 30 | Fill #1

## 2015-10-15 NOTE — Telephone Encounter (Signed)
May refill 

## 2015-10-15 NOTE — Telephone Encounter (Signed)
Patient called requesting a refill on his tramadol 

## 2015-10-15 NOTE — Telephone Encounter (Signed)
Spoke with patient and he is aware his RX for tramadol is Ready for pick up

## 2015-10-16 MED FILL — traMADol HCL 50 MG TABS: 50 | 30 days supply | Qty: 60 | Fill #0

## 2015-11-08 ENCOUNTER — Other Ambulatory Visit: Payer: Self-pay

## 2015-11-08 DIAGNOSIS — M549 Dorsalgia, unspecified: Principal | ICD-10-CM

## 2015-11-08 DIAGNOSIS — G8929 Other chronic pain: Secondary | ICD-10-CM

## 2015-11-08 MED ORDER — TRAMADOL HCL 50 MG PO TABS: 50.0000 mg | ORAL_TABLET | Freq: Two times a day (BID) | ORAL | Status: DC | PRN

## 2015-11-11 ENCOUNTER — Telehealth: Payer: Self-pay

## 2015-11-11 MED ORDER — CYCLOBENZAPRINE HCL 10 MG PO TABS: ORAL_TABLET | ORAL | Status: DC

## 2015-11-11 NOTE — Telephone Encounter (Signed)
Patient had called and left a voice mail to have his muscle  Relaxer refilled [facility]

## 2015-11-15 MED FILL — traMADol HCL 50 MG TABS: 50 | 30 days supply | Qty: 60 | Fill #0

## 2015-12-09 MED FILL — ?OMEPRAZOLE DR 20 MG CAPSUL: 20 | 30 days supply | Qty: 30 | Fill #2

## 2015-12-16 ENCOUNTER — Telehealth: Payer: Self-pay | Admitting: Internal Medicine

## 2015-12-16 NOTE — Telephone Encounter (Signed)
Pt. Came in today requesting a med refill on the following medications:  Tramadol cyclobenzaprine (FLEXERIL) 10 MG tablet  Please f/u with pt.

## 2015-12-18 NOTE — Telephone Encounter (Addendum)
Patent came in to follow up on his request for tramadol...  This is the 3rd time patient has came in to follow up on request.  1st request was sent to clinical on the 1st of may.  Please follow up with patient [phone]

## 2015-12-23 ENCOUNTER — Other Ambulatory Visit: Payer: Self-pay | Admitting: Internal Medicine

## 2015-12-23 DIAGNOSIS — M549 Dorsalgia, unspecified: Principal | ICD-10-CM

## 2015-12-23 DIAGNOSIS — G8929 Other chronic pain: Secondary | ICD-10-CM

## 2015-12-23 MED ORDER — CYCLOBENZAPRINE HCL 10 MG PO TABS: ORAL_TABLET | ORAL | Status: DC

## 2015-12-23 MED ORDER — TRAMADOL HCL 50 MG PO TABS: 50.0000 mg | ORAL_TABLET | Freq: Two times a day (BID) | ORAL | Status: DC | PRN

## 2015-12-24 MED FILL — traMADol HCL 50 MG TABS: 50 | 30 days supply | Qty: 60 | Fill #0

## 2015-12-24 MED FILL — CYCLOBENZAPRINE 10 MG TAB: 10 | 30 days supply | Qty: 90 | Fill #0

## 2015-12-31 ENCOUNTER — Other Ambulatory Visit: Payer: Self-pay | Admitting: *Deleted

## 2015-12-31 NOTE — Telephone Encounter (Signed)
Patients request was filled on 12/23/15 MA unable to contact patient. Patients VM has not been set up yet.  !!!Please inform patient of request being completed on 12/23/15!!!

## 2016-01-06 ENCOUNTER — Ambulatory Visit: Payer: Self-pay | Attending: Internal Medicine

## 2016-01-23 ENCOUNTER — Telehealth: Payer: Self-pay | Admitting: Internal Medicine

## 2016-01-23 DIAGNOSIS — J449 Chronic obstructive pulmonary disease, unspecified: Secondary | ICD-10-CM

## 2016-01-23 NOTE — Telephone Encounter (Signed)
Medication Refill: albuterol (PROVENTIL HFA;VENTOLIN HFA) 108 (90 Base) MCG/ACT inhaler and traMADol (ULTRAM) 50 MG tablet  Pt is on wait list for an appointment to establish care with a new provider

## 2016-01-28 MED FILL — ?OMEPRAZOLE DR 20 MG CAPSUL: 20 | 30 days supply | Qty: 30 | Fill #3

## 2016-01-29 ENCOUNTER — Other Ambulatory Visit: Payer: Self-pay | Admitting: Internal Medicine

## 2016-01-29 DIAGNOSIS — G8929 Other chronic pain: Secondary | ICD-10-CM

## 2016-01-29 DIAGNOSIS — M549 Dorsalgia, unspecified: Principal | ICD-10-CM

## 2016-01-29 MED ORDER — TRAMADOL HCL 50 MG PO TABS: 50.0000 mg | ORAL_TABLET | Freq: Two times a day (BID) | ORAL | Status: DC | PRN

## 2016-01-29 MED ORDER — ALBUTEROL SULFATE HFA 108 (90 BASE) MCG/ACT IN AERS: 2.0000 | INHALATION_SPRAY | RESPIRATORY_TRACT | Status: DC | PRN

## 2016-01-29 MED FILL — !VENTOLIN HFA INHALER: 108 (90 BAS | 15 days supply | Qty: 18 | Fill #0

## 2016-01-29 NOTE — Telephone Encounter (Signed)
I have refilled the albuterol. I will forward the request to Dr. Hyman HopesJegede for tramadol but patient may need to be seen before we can order it. Will defer to Dr. Hyman HopesJegede.

## 2016-01-29 NOTE — Telephone Encounter (Signed)
Patient needs albuterol and tramadol.

## 2016-01-30 MED FILL — traMADol HCL 50 MG TABS: 50 | 30 days supply | Qty: 60 | Fill #0

## 2016-02-17 ENCOUNTER — Ambulatory Visit (INDEPENDENT_AMBULATORY_CARE_PROVIDER_SITE_OTHER): Payer: Self-pay | Admitting: Internal Medicine

## 2016-02-17 ENCOUNTER — Encounter: Payer: Self-pay | Admitting: Internal Medicine

## 2016-02-17 VITALS — BP 122/80 | HR 92 | Temp 97.2°F | Ht 73.0 in | Wt 218.0 lb

## 2016-02-17 DIAGNOSIS — B182 Chronic viral hepatitis C: Secondary | ICD-10-CM

## 2016-02-17 DIAGNOSIS — G8929 Other chronic pain: Secondary | ICD-10-CM

## 2016-02-17 DIAGNOSIS — K7460 Unspecified cirrhosis of liver: Secondary | ICD-10-CM

## 2016-02-17 DIAGNOSIS — M549 Dorsalgia, unspecified: Secondary | ICD-10-CM

## 2016-02-17 NOTE — Assessment & Plan Note (Signed)
He will discuss with his pcp

## 2016-02-17 NOTE — Assessment & Plan Note (Signed)
i discussed the results of the elastography and likely cirrhosis on ultrasound.  I disussed the critical need to stop drinking.  Will need HCC screening every 6 months but he is unable to get it now since he is getting bills from ultrasound from elastography. Will try to get next visit.

## 2016-02-17 NOTE — Progress Notes (Signed)
   Subjective:    Patient ID: Jordan Bradshaw, male    DOB: 1961/04/15, 55 y.o.   MRN: [id number]  HPI Here for follow up of HCV.  Genotype 1a, no baseline resistance, history of alcohol abuse and elastography with F3/4 and 'query cirrhosis" on ultrasound.  He did get started and complete Zepatier 12 weeks.  No problems during treatment.  Still drinking due to chronic pain not relieved by pain medications.  Daily drinking.  Doesn't feel he can stop.  Wants to get disability for cirrhosis.  No weight loss.    Review of Systems  Constitutional: Negative for fatigue.  Skin: Negative for rash.  Neurological: Negative for headaches.       Objective:   Physical Exam  Constitutional: He appears well-developed and well-nourished.  Eyes: No scleral icterus.  Cardiovascular: Normal rate, regular rhythm and normal heart sounds.   Musculoskeletal: He exhibits no edema.  Skin: No rash noted.   Social History   Social History Narrative  drinking daily       Assessment & Plan:

## 2016-02-20 LAB — HEPATITIS C RNA QUANTITATIVE: HCV Quantitative: NOT DETECTED IU/mL (ref <15)

## 2016-02-27 ENCOUNTER — Telehealth: Payer: Self-pay | Admitting: Internal Medicine

## 2016-02-27 DIAGNOSIS — J449 Chronic obstructive pulmonary disease, unspecified: Secondary | ICD-10-CM

## 2016-02-27 MED ORDER — FLUTICASONE-SALMETEROL 250-50 MCG/DOSE IN AEPB: 1.0000 | INHALATION_SPRAY | Freq: Two times a day (BID) | RESPIRATORY_TRACT | Status: DC

## 2016-02-27 NOTE — Telephone Encounter (Signed)
Pt. Came into facility requesting a refill on the following medications:  Tramadol Fluticasone-Salmeterol (ADVAIR) 250-50 MCG/DOSE AEPB   Please f/u with pt.

## 2016-03-02 ENCOUNTER — Ambulatory Visit: Payer: Self-pay | Attending: Internal Medicine

## 2016-03-02 NOTE — Telephone Encounter (Signed)
Patient is following up regarding request for tramadol.. Patient expresses concern on delay on medication request...  Please follow up with patient , he will be going out of town Thursday and would like to have his medication

## 2016-03-02 NOTE — Telephone Encounter (Signed)
Pt. Came into facility requesting a refill on Tramadol. Please f/u with pt.  °

## 2016-03-04 ENCOUNTER — Other Ambulatory Visit: Payer: Self-pay | Admitting: *Deleted

## 2016-03-04 DIAGNOSIS — M549 Dorsalgia, unspecified: Principal | ICD-10-CM

## 2016-03-04 DIAGNOSIS — G8929 Other chronic pain: Secondary | ICD-10-CM

## 2016-03-04 MED ORDER — TRAMADOL HCL 50 MG PO TABS: 50.0000 mg | ORAL_TABLET | Freq: Two times a day (BID) | ORAL | Status: DC | PRN

## 2016-03-05 MED FILL — traMADol HCL 50 MG TABS: 50 | 30 days supply | Qty: 60 | Fill #0

## 2016-03-06 NOTE — Telephone Encounter (Signed)
Patients medication was placed at the front desk for pick-up.

## 2016-03-24 MED FILL — ?CYCLOBENZAPRINE 10 MG TABL: 10 | 30 days supply | Qty: 90 | Fill #0

## 2016-03-24 MED FILL — IBUPROFEN 600 MG TABLET: 600 | 10 days supply | Qty: 30 | Fill #1

## 2016-03-27 ENCOUNTER — Telehealth: Payer: Self-pay | Admitting: Internal Medicine

## 2016-03-27 NOTE — Telephone Encounter (Signed)
Pt came in to facility requesting medication refills for omeprazole  and tramadol . Pt hasn't established with a PCP yet. No appts available at the moment. Please follow up.  Thank you, Johanna B.

## 2016-03-30 NOTE — Telephone Encounter (Signed)
Patient needs to establish medical care before any refill is allowed. Thank you

## 2016-04-27 ENCOUNTER — Encounter: Payer: Self-pay | Admitting: Internal Medicine

## 2016-04-27 ENCOUNTER — Ambulatory Visit: Payer: Self-pay | Attending: Internal Medicine | Admitting: Internal Medicine

## 2016-04-27 VITALS — BP 151/102 | HR 95 | Temp 97.4°F | Resp 16 | Wt 218.8 lb

## 2016-04-27 DIAGNOSIS — M48062 Spinal stenosis, lumbar region with neurogenic claudication: Secondary | ICD-10-CM

## 2016-04-27 DIAGNOSIS — M4802 Spinal stenosis, cervical region: Secondary | ICD-10-CM

## 2016-04-27 DIAGNOSIS — F17210 Nicotine dependence, cigarettes, uncomplicated: Secondary | ICD-10-CM | POA: Insufficient documentation

## 2016-04-27 DIAGNOSIS — K029 Dental caries, unspecified: Secondary | ICD-10-CM

## 2016-04-27 DIAGNOSIS — Z23 Encounter for immunization: Secondary | ICD-10-CM | POA: Insufficient documentation

## 2016-04-27 DIAGNOSIS — M549 Dorsalgia, unspecified: Secondary | ICD-10-CM | POA: Insufficient documentation

## 2016-04-27 DIAGNOSIS — J449 Chronic obstructive pulmonary disease, unspecified: Secondary | ICD-10-CM | POA: Insufficient documentation

## 2016-04-27 DIAGNOSIS — B182 Chronic viral hepatitis C: Secondary | ICD-10-CM | POA: Insufficient documentation

## 2016-04-27 DIAGNOSIS — K7460 Unspecified cirrhosis of liver: Secondary | ICD-10-CM | POA: Insufficient documentation

## 2016-04-27 DIAGNOSIS — M4806 Spinal stenosis, lumbar region: Secondary | ICD-10-CM | POA: Insufficient documentation

## 2016-04-27 DIAGNOSIS — IMO0001 Reserved for inherently not codable concepts without codable children: Secondary | ICD-10-CM

## 2016-04-27 DIAGNOSIS — Z79899 Other long term (current) drug therapy: Secondary | ICD-10-CM | POA: Insufficient documentation

## 2016-04-27 DIAGNOSIS — Z1211 Encounter for screening for malignant neoplasm of colon: Secondary | ICD-10-CM

## 2016-04-27 DIAGNOSIS — G8929 Other chronic pain: Secondary | ICD-10-CM | POA: Insufficient documentation

## 2016-04-27 MED ORDER — TRAMADOL HCL 50 MG PO TABS: 50.0000 mg | ORAL_TABLET | Freq: Two times a day (BID) | ORAL | 0 refills | Status: DC | PRN

## 2016-04-27 MED ORDER — FLUTICASONE-SALMETEROL 250-50 MCG/DOSE IN AEPB: 1.0000 | INHALATION_SPRAY | Freq: Two times a day (BID) | RESPIRATORY_TRACT | 0 refills | Status: DC

## 2016-04-27 MED ORDER — CYCLOBENZAPRINE HCL 10 MG PO TABS: ORAL_TABLET | ORAL | 3 refills | Status: AC

## 2016-04-27 MED ORDER — ALBUTEROL SULFATE HFA 108 (90 BASE) MCG/ACT IN AERS: 2.0000 | INHALATION_SPRAY | RESPIRATORY_TRACT | 2 refills | Status: DC | PRN

## 2016-04-27 MED ORDER — CYCLOBENZAPRINE HCL 10 MG PO TABS: ORAL_TABLET | ORAL | 3 refills | Status: DC

## 2016-04-27 MED FILL — traMADol HCL 50 MG TABS: 50 | 30 days supply | Qty: 60 | Fill #0

## 2016-04-27 NOTE — Patient Instructions (Addendum)
Back Exercises If you have pain in your back, do these exercises 2-3 times each day or as told by your doctor. When the pain goes away, do the exercises once each day, but repeat the steps more times for each exercise (do more repetitions). If you do not have pain in your back, do these exercises once each day or as told by your doctor. EXERCISES Single Knee to Chest Do these steps 3-5 times in a row for each leg: 1. Lie on your back on a firm bed or the floor with your legs stretched out. 2. Bring one knee to your chest. 3. Hold your knee to your chest by grabbing your knee or thigh. 4. Pull on your knee until you feel a gentle stretch in your lower back. 5. Keep doing the stretch for 10-30 seconds. 6. Slowly let go of your leg and straighten it. Pelvic Tilt Do these steps 5-10 times in a row: 1. Lie on your back on a firm bed or the floor with your legs stretched out. 2. Bend your knees so they point up to the ceiling. Your feet should be flat on the floor. 3. Tighten your lower belly (abdomen) muscles to press your lower back against the floor. This will make your tailbone point up to the ceiling instead of pointing down to your feet or the floor. 4. Stay in this position for 5-10 seconds while you gently tighten your muscles and breathe evenly. Cat-Cow Do these steps until your lower back bends more easily: 1. Get on your hands and knees on a firm surface. Keep your hands under your shoulders, and keep your knees under your hips. You may put padding under your knees. 2. Let your head hang down, and make your tailbone point down to the floor so your lower back is round like the back of a cat. 3. Stay in this position for 5 seconds. 4. Slowly lift your head and make your tailbone point up to the ceiling so your back hangs low (sags) like the back of a cow. 5. Stay in this position for 5 seconds. Press-Ups Do these steps 5-10 times in a row: 1. Lie on your belly (face-down) on the  floor. 2. Place your hands near your head, about shoulder-width apart. 3. While you keep your back relaxed and keep your hips on the floor, slowly straighten your arms to raise the top half of your body and lift your shoulders. Do not use your back muscles. To make yourself more comfortable, you may change where you place your hands. 4. Stay in this position for 5 seconds. 5. Slowly return to lying flat on the floor. Bridges Do these steps 10 times in a row: 1. Lie on your back on a firm surface. 2. Bend your knees so they point up to the ceiling. Your feet should be flat on the floor. 3. Tighten your butt muscles and lift your butt off of the floor until your waist is almost as high as your knees. If you do not feel the muscles working in your butt and the back of your thighs, slide your feet 1-2 inches farther away from your butt. 4. Stay in this position for 3-5 seconds. 5. Slowly lower your butt to the floor, and let your butt muscles relax. If this exercise is too easy, try doing it with your arms crossed over your chest. Belly Crunches Do these steps 5-10 times in a row: 1. Lie on your back on a firm bed  or the floor with your legs stretched out. 2. Bend your knees so they point up to the ceiling. Your feet should be flat on the floor. 3. Cross your arms over your chest. 4. Tip your chin a little bit toward your chest but do not bend your neck. 5. Tighten your belly muscles and slowly raise your chest just enough to lift your shoulder blades a tiny bit off of the floor. 6. Slowly lower your chest and your head to the floor. Back Lifts Do these steps 5-10 times in a row: 1. Lie on your belly (face-down) with your arms at your sides, and rest your forehead on the floor. 2. Tighten the muscles in your legs and your butt. 3. Slowly lift your chest off of the floor while you keep your hips on the floor. Keep the back of your head in line with the curve in your back. Look at the floor while  you do this. 4. Stay in this position for 3-5 seconds. 5. Slowly lower your chest and your face to the floor. GET HELP IF:  Your back pain gets a lot worse when you do an exercise.  Your back pain does not lessen 2 hours after you exercise. If you have any of these problems, stop doing the exercises. Do not do them again unless your doctor says it is okay. GET HELP RIGHT AWAY IF:  You have sudden, very bad back pain. If this happens, stop doing the exercises. Do not do them again unless your doctor says it is okay.   This information is not intended to replace advice given to you by your health care provider. Make sure you discuss any questions you have with your health care provider.   Document Released: 09/05/2010 Document Revised: 04/24/2015 Document Reviewed: 09/27/2014 Elsevier Interactive Patient Education 2016 [employer]   -  RICE for Routine Care of Injuries Many injuries can be cared for using rest, ice, compression, and elevation (RICE therapy). Using RICE therapy can help to lessen pain and swelling. It can help your body to heal. Rest Reduce your normal activities and avoid using the injured part of your body. You can go back to your normal activities when you feel okay and your doctor says it is okay. Ice Do not put ice on your bare skin.  Put ice in a plastic bag.  Place a towel between your skin and the bag.  Leave the ice on for 20 minutes, 2-3 times a day. Do this for as long as told by your doctor. Compression Compression means putting pressure on the injured area. This can be done with an elastic bandage. If an elastic bandage has been applied:  Remove and reapply the bandage every 3-4 hours or as told by your doctor.  Make sure the bandage is not wrapped too tight. Wrap the bandage more loosely if part of your body beyond the bandage is blue, swollen, cold, painful, or loses feeling (numb).  See your doctor if the bandage seems to make your problems  worse. Elevation Elevation means keeping the injured area raised. Raise the injured area above your heart or the center of your chest if you can. WHEN SHOULD I GET HELP? You should get help if:  You keep having pain and swelling.  Your symptoms get worse. WHEN SHOULD I GET HELP RIGHT AWAY? You should get help right away if:  You have sudden bad pain at or below the area of your injury.  You have redness  or more swelling around your injury.  You have tingling or numbness at or below the injury that does not go away when you take off the bandage.   This information is not intended to replace advice given to you by your health care provider. Make sure you discuss any questions you have with your health care provider.   Document Released: 01/20/2008 Document Revised: 04/24/2015 Document Reviewed: 07/11/2014 Elsevier Interactive Patient Education 2016 [employer]  Influenza Virus Vaccine injection (Fluarix) What is this medicine? INFLUENZA VIRUS VACCINE (in floo EN zuh VAHY ruhs vak SEEN) helps to reduce the risk of getting influenza also known as the flu. This medicine may be used for other purposes; ask your health care provider or pharmacist if you have questions. What should I tell my health care provider before I take this medicine? They need to know if you have any of these conditions: -bleeding disorder like hemophilia -fever or infection -Guillain-Barre syndrome or other neurological problems -immune system problems -infection with the human immunodeficiency virus (HIV) or AIDS -low blood platelet counts -multiple sclerosis -an unusual or allergic reaction to influenza virus vaccine, eggs, chicken proteins, latex, gentamicin, other medicines, foods, dyes or preservatives -pregnant or trying to get pregnant -breast-feeding How should I use this medicine? This vaccine is for injection into a muscle. It is given by a health care professional. A copy of Vaccine Information  Statements will be given before each vaccination. Read this sheet carefully each time. The sheet may change frequently. Talk to your pediatrician regarding the use of this medicine in children. Special care may be needed. Overdosage: If you think you have taken too much of this medicine contact a poison control center or emergency room at once. NOTE: This medicine is only for you. Do not share this medicine with others. What if I miss a dose? This does not apply. What may interact with this medicine? -chemotherapy or radiation therapy -medicines that lower your immune system like etanercept, anakinra, infliximab, and adalimumab -medicines that treat or prevent blood clots like warfarin -phenytoin -steroid medicines like prednisone or cortisone -theophylline -vaccines This list may not describe all possible interactions. Give your health care provider a list of all the medicines, herbs, non-prescription drugs, or dietary supplements you use. Also tell them if you smoke, drink alcohol, or use illegal drugs. Some items may interact with your medicine. What should I watch for while using this medicine? Report any side effects that do not go away within 3 days to your doctor or health care professional. Call your health care provider if any unusual symptoms occur within 6 weeks of receiving this vaccine. You may still catch the flu, but the illness is not usually as bad. You cannot get the flu from the vaccine. The vaccine will not protect against colds or other illnesses that may cause fever. The vaccine is needed every year. What side effects may I notice from receiving this medicine? Side effects that you should report to your doctor or health care professional as soon as possible: -allergic reactions like skin rash, itching or hives, swelling of the face, lips, or tongue Side effects that usually do not require medical attention (report to your doctor or health care professional if they continue  or are bothersome): -fever -headache -muscle aches and pains -pain, tenderness, redness, or swelling at site where injected -weak or tired This list may not describe all possible side effects. Call your doctor for medical advice about side effects. You may report side  effects to FDA at 1-800-FDA-1088. Where should I keep my medicine? This vaccine is only given in a clinic, pharmacy, doctor's office, or other health care setting and will not be stored at home. NOTE: This sheet is a summary. It may not cover all possible information. If you have questions about this medicine, talk to your doctor, pharmacist, or health care provider.    2016, Elsevier/[employer]. (2008-02-29 09:30:40)

## 2016-04-27 NOTE — Progress Notes (Signed)
Pt is in the office today for establish care  Pt states his pain level today in the office is a 10 when standing but sitting the pain eases off

## 2016-04-27 NOTE — Progress Notes (Signed)
Jordan Sloughhomas Pai, is a 55 y.o. male  ZOX:[id number]  [id number]  DOB - Jun 17, 1961  CC:  Chief Complaint  Patient presents with  . Establish Care       HPI: Jordan Bradshaw is a 55 y.o. male here today to establish medical care.  Hx of hep c sp treatment by ID, cirrhosis, copd, chronic cervical and lumbar stenosis and pain, tob and etoh abuse.  Per pt, it has been a rough month, he stopped drinking etoh and smoking.    Using his advair every few days, not using the albuterol often.  Chronic neck/back pains, which tramadol helps some.  He use to see neurologist, who sent him for injections in past which helped.  C/o of intermittent loose stools, better when takes ultram. Denies w/d sx/anxiety.  Patient has No headache, No chest pain, No abdominal pain - No Nausea, No new weakness tingling or numbness, No Cough - SOB.    Ros:  Per hpi, o/w all systems reviewed and negative.   No Known Allergies Past Medical History:  Diagnosis Date  . Asthma   . COPD (chronic obstructive pulmonary disease) (HCC)   . COPD GOLD II 06/28/2013   Followed in Pulmonary clinic/ [facility]/ [facility] - hfa 75% 06/26/13  - PFTs 07/31/2013  FEV1 2.94 (69%) ratio 61 and no better p B2   DLCO 69 corrects to 80    Current Outpatient Prescriptions on File Prior to Visit  Medication Sig Dispense Refill  . albuterol (PROVENTIL) (2.5 MG/3ML) 0.083% nebulizer solution Take 3 mLs (2.5 mg total) by nebulization every 6 (six) hours as needed for wheezing or shortness of breath. 150 mL 3  . fluticasone (FLONASE) 50 MCG/ACT nasal spray Place 2 sprays into both nostrils daily. 16 g 6  . gabapentin (NEURONTIN) 400 MG capsule Take 1 capsule (400 mg total) by mouth 3 (three) times daily. 90 capsule 3  . ibuprofen (ADVIL,MOTRIN) 600 MG tablet TAKE ONE TABLET EVERY 8 HOURS AS NEEDED FOR MODERATE PAIN 30 tablet 3  . meloxicam (MOBIC) 15 MG tablet Take 1 tablet (15 mg total) by mouth daily. 30 tablet 2  . omeprazole  (PRILOSEC) 20 MG capsule Take 1 capsule (20 mg total) by mouth daily. 30 capsule 3  . tamsulosin (FLOMAX) 0.4 MG CAPS capsule Take 1 capsule (0.4 mg total) by mouth daily. (Patient not taking: Reported on 04/27/2016) 30 capsule 3   No current facility-administered medications on file prior to visit.    Family History  Problem Relation Age of Onset  . Lung cancer Sister     smoked  . Ovarian cancer Mother    Social History   Social History  . Marital status: Single    Spouse name: N/A  . Number of children: N/A  . Years of education: N/A   Occupational History  . Unemployed    Social History Main Topics  . Smoking status: Current Every Day Smoker    Packs/day: 0.50    Years: 35.00    Types: Cigarettes  . Smokeless tobacco: Current User  . Alcohol use 25.2 oz/week    42 Standard drinks or equivalent per week     Comment: six pack a day of beer  . Drug use: No  . Sexual activity: Not on file   Other Topics Concern  . Not on file   Social History Narrative  . No narrative on file    Objective:   Vitals:   04/27/16 1431  BP: (!) 151/102  Pulse: 95  Resp: 16  Temp: 97.4 F (36.3 C)    Filed Weights   04/27/16 1431  Weight: 218 lb 12.8 oz (99.2 kg)    BP Readings from Last 3 Encounters:  04/27/16 (!) 151/102  02/17/16 122/80  09/17/15 (!) 146/91    Physical Exam: Exam General appearance : Awake, alert, not in any distress. Speech Clear. Not toxic looking, disheveled, poor eye contact HEENT: Atraumatic, normoalcephalic. Mmm. Chest:Good air entry bilaterally, no added sounds. CVS: S1 S2 regular, no murmurs/gallups or rubs. Abdomen: Bowel sounds active, obese, Non tender Extremities: B/L Lower Ext shows no edema, both legs are warm to touch Neurology: Awake alert, and oriented X 3, CN II-XII grossly intact, Non focal Skin:No Rash   Lab Results  Component Value Date   WBC 4.1 05/07/2015   HGB 14.3 05/07/2015   HCT 40.2 05/07/2015   MCV 101.0 (H)  05/07/2015   PLT 145 (L) 05/07/2015   Lab Results  Component Value Date   CREATININE 0.77 05/07/2015   BUN 3 (L) 05/07/2015   NA 137 05/07/2015   K 4.3 05/07/2015   CL 100 05/07/2015   CO2 26 05/07/2015    Lab Results  Component Value Date   HGBA1C 5.1 10/02/2014   Lipid Panel     Component Value Date/Time   CHOL 143 10/02/2014 1232   TRIG 90 10/02/2014 1232   HDL 69 10/02/2014 1232   CHOLHDL 2.1 10/02/2014 1232   VLDL 18 10/02/2014 1232   LDLCALC 56 10/02/2014 1232       Depression screen PHQ 2/9 04/27/2016 02/17/2016 07/03/2015  Decreased Interest 1 3 3   Down, Depressed, Hopeless 1 3 3   PHQ - 2 Score 2 6 6   Altered sleeping 1 3 2   Tired, decreased energy 1 3 2   Change in appetite 2 2 1   Feeling bad or failure about yourself  3 3 1   Trouble concentrating 0 2 0  Moving slowly or fidgety/restless 0 1 0  Suicidal thoughts 0 0 0  PHQ-9 Score 9 20 12   Difficult doing work/chores - - Somewhat difficult    12/16 abd US IMPRESSION: 1. Coarse hepatic echotexture with some questionable lobularity of the hepatic margin, query cirrhosis. 2. 7 by 4 mm gallbladder polyp.  Median hepatic shear wave velocity is calculated at 2.83 m/sec.  Corresponding Metavir fibrosis score is Some F3+F4.  Risk of fibrosis is high.  Follow-up:  Advised   Electronically Signed   By: Gaylyn Rong M.D.   On: 07/24/2015 09:11   Ct cerv and lumbar myelogram 12/12/13 // FINDINGS: CERVICAL AND LUMBAR MYELOGRAM FINDINGS:  Severe cervical facet arthrosis is present at C4-C5. There is severe spinal stenosis at C4-C5 as well which appears due to both posterior ligamentum flavum redundancy and and anterior disc bulge or protrusion. Transverse narrowing of the thecal sac is also present, likely due to facet arthrosis.  Cervical flexion extension views show 2 mm of anterolisthesis of C4 on C5 bed increase this to 3 mm with flexion and reduces slightly with extension. The  prevertebral soft tissues appear normal. Craniocervical alignment appears normal.  Lumbar spinal alignment shows grade I retrolisthesis of L4 on L5 measuring 6 mm. Some of this may be due to a obliquity. This does not change between flexion and extension maneuvers. Dextroconvex curve of the lumbar spine is present with the apex at L3-L4. L4-L5 and L5-S1 facet arthrosis with less pronounced L3-L4 facet arthrosis.  CT CERVICAL MYELOGRAM FINDINGS:  Dextroconvex torticollis  is present. In the supine position for CT scanning, 2 mm anterolisthesis of C4 on C5 is present. Atlantodental degenerative disease. Craniocervical alignment appears within normal limits. No cervical spine fracture or dislocation. Paraspinal soft tissues appear within normal limits.  C2-C3: Ankylosis of the facet joints with solid fusion. Right uncovertebral spurring and right foraminal stenosis are present, the significance of which is unclear in the setting of ankylosis. Central canal appears adequately patent.  C3-C4: Severe right facet arthrosis is present with severe right foraminal stenosis. Large anterior osteophytes from the facet joint produce most of the right foraminal stenosis. The left neural foramen is patent. The disc is desiccated and there is mild posterior ligamentum flavum redundancy. Moderate central stenosis is present with narrowing of the ventral and dorsal subarachnoid space and flattening of the right ventral cord. Left neural foramen is patent. Subchondral cysts are present on both sides of the right C3-C4 facet.  C4-C5: Severe central stenosis is present with near effacement of the subarachnoid space. Severe right-sided facet arthrosis and severe right foraminal stenosis. Right uncovertebral spurring contributes to right foraminal stenosis. The left neural foramen appears patent. The cord is markedly thin suggesting chronic stenosis and myelomalacia. Subchondral cystic changes are  present involving the right facet joint and extending into the right C4 lamina.  C5-C6: There is a shallow disc osteophyte complex with minimal central stenosis. The foramina appear adequately patent. Cystic changes are present in midline the of the posterior elements extending into the spinous process, probably representing tunneling subchondral geode from the severe right facet degeneration at C4-C5.  C6-C7: Disc degeneration and endplate sclerosis. Mild central stenosis with a shallow disc osteophyte complex. Mild symmetric bilateral foraminal stenosis due to uncovertebral spurring. Foraminal stenosis is symmetric.  C7-T1: Severe facet arthrosis with 3 mm of anterolisthesis of C7 on T1. Central canal appears adequately patent. Right greater than left moderate foraminal stenosis secondary to facet arthrosis.  CT LUMBAR MYELOGRAM FINDINGS:  Dextroconvex curve of the lumbar spine is present with the apex at L3-L4. Vertebral body height is preserved. Spinal cord terminates posterior to the L1 vertebra. Aortoiliac atherosclerosis. Mild thickening of the left adrenal gland appears unchanged. Congenital spinal stenosis is present with 13 mm AP diameter of the central canal at L4.  T12-L1:  Negative.  L1-L2:  Negative.  L2-L3:  Negative.  L3-L4: Disc desiccation. Circumferential disc bulge is present. Posterior ligamentum flavum redundancy. There is mild central stenosis which is due to combination of bulging disc, posterior ligamentum flavum redundancy and congenitally narrow central canal. Mild symmetric bilateral foraminal stenosis secondary to short pedicles.  L4-L5: Disc degeneration. Circumferential disc bulge is present. There is moderate bilateral foraminal stenosis, slightly greater on the right than left associated with disc bulging, retrolisthesis and congenitally short pedicles. Moderate central stenosis is present. This is multifactorial, associated with  congenitally narrow central canal, disc bulging and posterior ligamentum flavum redundancy. Left-greater-than-right bilateral facet arthrosis. Broad-based disc bulging produces encroachment on both lateral recesses, potentially affecting L4 nerves.  L5-S1: Asymmetric disc space collapse on the right with vacuum disc. Severe right foraminal stenosis associated with endplate osteophytes and collapse of the disc. This potentially affects the right L5 nerve. There is also a right facet arthrosis. The left neural foramen, central canal and both lateral recesses appear patent.  IMPRESSION: 1. Technically successful L2-L3 lumbar puncture for lumbar and cervical myelogram with Whitacre pencil point needle. 2. C2-C3 ankylosis with solid fusion of the facet joints. 3. C3-C4 severe right foraminal stenosis due  to severe right facet arthrosis. 4. C4-C5 Mild central stenosis with severe right foraminal stenosis secondary to facet arthrosis. Tunneling subchondral cysts are present which extend into the right C4-C5 lamina and spinous processes. 5. Less pronounced degenerative disc and facet disease from C5-C6 through C7-T1 detailed above. 6. 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 7. L3-L4 mild central stenosis and mild symmetric bilateral foraminal stenosis. 8. L4-L5 moderate central stenosis with mild bilateral lateral recess and moderate multifactorial bilateral foraminal stenosis. 9. L5-S1 asymmetric right-sided degenerative disc disease with severe right foraminal stenosis potentially affecting the right L5 nerve.   Electronically Signed   By: Andreas Newport M.D.   On: 12/12/2013 14:19  Assessment and plan:   1. Acute on chronic back pain, w/ Spinal stenosis, lumbar  And cervical region, with neurogenic claudication - per 2015 ct myelograms - pain contract signed today, would be void if PMR decides on stronger rx. - renewed ultram and  flexeril - Ambulatory referral to Pain Clinic - may benefit from injections. - traMADol (ULTRAM) 50 MG tablet; Take 1 tablet (50 mg total) by mouth every 12 (twelve) hours as needed for severe pain.  Dispense: 60 tablet; Refill: 0  2. Chronic hepatitis C without hepatic coma (HCC) Being treated by ID, sp treatment.  3. Cirrhosis of liver without ascites, unspecified hepatic cirrhosis type (HCC) Per pt, stopped drinking Alcohol last month.   4. COPD GOLD II - pt per stopped smoking last month - recd uses advair daily, and keep albuterol for prn only. - Fluticasone-Salmeterol (ADVAIR) 250-50 MCG/DOSE AEPB; Inhale 1 puff into the lungs every 12 (twelve) hours. Needs office visit for refills  Dispense: 3 each; Refill: 0 - albuterol (PROVENTIL HFA;VENTOLIN HFA) 108 (90 Base) MCG/ACT inhaler; Inhale 2 puffs into the lungs every 4 (four) hours as needed for wheezing or shortness of breath.  Dispense: 1 Inhaler; Refill: 2  5. Colon cancer screening - Ambulatory referral to Gastroenterology  6. Flu vaccine need - Flu Vaccine QUAD 36+ mos PF IM (Fluarix & Fluzone Quad PF)  7. Cavities - Ambulatory referral to Dentistry  8. Poor eye contact - not certain, denied depression, but lots of stressors.   Return in about 3 months (around 07/27/2016), or if symptoms worsen or fail to improve.  The patient was given clear instructions to go to ER or return to medical center if symptoms don't improve, worsen or new problems develop. The patient verbalized understanding. The patient was told to call to get lab results if they haven't heard anything in the next week.    This note has been created with Education officer, environmental. Any transcriptional errors are unintentional.   Pete Glatter, MD, MBA/MHA [facility] [place], [place] [phone]   04/27/2016, 3:06 PM    -

## 2016-04-28 MED FILL — VENTOLIN HFA 90 MCG INHALER: 108 (90 BAS | 20 days supply | Qty: 18 | Fill #0

## 2016-04-28 MED FILL — ?CYCLOBENZAPRINE 10 MG TABL: 10 | 30 days supply | Qty: 90 | Fill #0

## 2016-04-28 MED FILL — !ADVAIR 250/50 DISKUS: 250-50 | 20 days supply | Qty: 60 | Fill #0

## 2016-05-26 ENCOUNTER — Telehealth: Payer: Self-pay | Admitting: Internal Medicine

## 2016-05-26 ENCOUNTER — Other Ambulatory Visit: Payer: Self-pay | Admitting: Internal Medicine

## 2016-05-26 DIAGNOSIS — M545 Low back pain: Principal | ICD-10-CM

## 2016-05-26 DIAGNOSIS — K219 Gastro-esophageal reflux disease without esophagitis: Secondary | ICD-10-CM

## 2016-05-26 DIAGNOSIS — G8929 Other chronic pain: Secondary | ICD-10-CM

## 2016-05-26 MED ORDER — TRAMADOL HCL 50 MG PO TABS: 50.0000 mg | ORAL_TABLET | Freq: Two times a day (BID) | ORAL | 0 refills | Status: DC | PRN

## 2016-05-26 MED ORDER — OMEPRAZOLE 20 MG PO CPDR: 20.0000 mg | DELAYED_RELEASE_CAPSULE | Freq: Every day | ORAL | 3 refills | Status: AC

## 2016-05-26 MED FILL — ?OMEPRAZOLE DR 20 MG CAPSUL: 20 | 30 days supply | Qty: 30 | Fill #0

## 2016-05-26 NOTE — Progress Notes (Signed)
Please call pt to pick up rx. Has signed pain contract prior. thx

## 2016-05-26 NOTE — Progress Notes (Signed)
Contacted pt to make aware his rx is ready pt vm is not set up

## 2016-05-26 NOTE — Telephone Encounter (Signed)
Pt. Came into facility requesting a refill on Tramadol. Please f/u °

## 2016-05-26 NOTE — Telephone Encounter (Signed)
Pt. Came into facility requesting a refill on the following medications:  omeprazole (PRILOSEC) 20 MG capsule

## 2016-05-27 MED FILL — traMADol HCL 50 MG TABS: 50 | 30 days supply | Qty: 60 | Fill #0

## 2016-06-22 ENCOUNTER — Ambulatory Visit: Payer: Self-pay | Admitting: Physical Medicine & Rehabilitation

## 2016-06-22 ENCOUNTER — Encounter: Payer: Self-pay | Admitting: Physical Medicine & Rehabilitation

## 2016-06-22 NOTE — Progress Notes (Signed)
Subjective:    Patient ID: Jordan Bradshaw, male    DOB: Apr 13, 1961, 55 y.o.   MRN: [id number]  HPI Consult requested further evaluation of spinal injections for  Pain Inventory Average Pain 8 Pain Right Now 8 My pain is constant, sharp, burning, stabbing and aching  In the last 24 hours, has pain interfered with the following? General activity 9 Relation with others 9 Enjoyment of life 9 What TIME of day is your pain at its worst? all Sleep (in general) Poor  Pain is worse with: walking, bending and standing Pain improves with: nothing Relief from Meds: 2  Mobility walk with assistance use a cane how many minutes can you walk? 5-10 ability to climb steps?  yes do you drive?  yes  Function not employed: date last employed . I need assistance with the following:  meal prep, household duties and shopping  Neuro/Psych trouble walking  Prior Studies new visit  Physicians involved in your care new visit   Family History  Problem Relation Age of Onset  . Lung cancer Sister     smoked  . Ovarian cancer Mother    Social History   Social History  . Marital status: Single    Spouse name: N/A  . Number of children: N/A  . Years of education: N/A   Occupational History  . Unemployed    Social History Main Topics  . Smoking status: Current Every Day Smoker    Packs/day: 0.50    Years: 35.00    Types: Cigarettes  . Smokeless tobacco: Current User  . Alcohol use 25.2 oz/week    42 Standard drinks or equivalent per week     Comment: six pack a day of beer  . Drug use: No  . Sexual activity: Not Asked   Other Topics Concern  . None   Social History Narrative  . None   Past Surgical History:  Procedure Laterality Date  . EYE SURGERY     Past Medical History:  Diagnosis Date  . Asthma   . COPD (chronic obstructive pulmonary disease) (HCC)   . COPD GOLD II 06/28/2013   Followed in Pulmonary clinic/ [facility]/ [facility] - hfa 75% 06/26/13  -  PFTs 07/31/2013  FEV1 2.94 (69%) ratio 61 and no better p B2   DLCO 69 corrects to 80    BP 122/86 (BP Location: Right Arm, Patient Position: Sitting, Cuff Size: Large)   Pulse (!) 106   Resp 14   SpO2 94%   Opioid Risk Score:   Fall Risk Score:  `1  Depression screen PHQ 2/9  [facility] 04/27/2016 02/17/2016 07/03/2015  Decreased Interest 1 3 3   Down, Depressed, Hopeless 1 3 3   PHQ - 2 Score 2 6 6   Altered sleeping 1 3 2   Tired, decreased energy 1 3 2   Change in appetite 2 2 1   Feeling bad or failure about yourself  3 3 1   Trouble concentrating 0 2 0  Moving slowly or fidgety/restless 0 1 0  Suicidal thoughts 0 0 0  PHQ-9 Score 9 20 12   Difficult doing work/chores - - Somewhat difficult    Review of Systems  Constitutional: Negative.   HENT: Negative.   Eyes: Negative.   Respiratory: Negative.   Cardiovascular: Negative.   Gastrointestinal: Positive for nausea.  Endocrine: Negative.   Genitourinary: Negative.   Musculoskeletal: Positive for arthralgias, back pain, gait problem, myalgias, neck pain and neck stiffness.  Skin: Negative.   Allergic/Immunologic:  Negative.   Hematological: Negative.   Psychiatric/Behavioral: Negative.   All other systems reviewed and are negative.      Objective:   Physical Exam        Assessment & Plan:

## 2016-06-29 ENCOUNTER — Telehealth: Payer: Self-pay | Admitting: Internal Medicine

## 2016-06-29 DIAGNOSIS — G8929 Other chronic pain: Secondary | ICD-10-CM

## 2016-06-29 DIAGNOSIS — M545 Low back pain: Principal | ICD-10-CM

## 2016-06-29 MED FILL — ?OMEPRAZOLE DR 20 MG CAPSUL: 20 | 30 days supply | Qty: 30 | Fill #1

## 2016-06-29 NOTE — Telephone Encounter (Signed)
Patient called would like to talk to nurse or doctor regarding dental referral. Please f/u with patient.

## 2016-06-29 NOTE — Telephone Encounter (Signed)
Patient called requesting Rx:     Tramadol    ° °                    °Please call patient when Rx ready for pick up. ° ° °

## 2016-06-30 MED ORDER — TRAMADOL HCL 50 MG PO TABS
50.0000 mg | ORAL_TABLET | Freq: Two times a day (BID) | ORAL | 0 refills | Status: DC | PRN
Start: 2016-06-30 — End: 2016-07-27

## 2016-06-30 MED FILL — traMADol HCL 50 MG TABS: 50 | 30 days supply | Qty: 60 | Fill #0

## 2016-06-30 NOTE — Telephone Encounter (Signed)
Contacted pt to make aware of rx for tramadol ready for pick up also to see why he is needing a dental referral

## 2016-06-30 NOTE — Telephone Encounter (Signed)
Call pt rx is ready for pick up . thanks

## 2016-07-06 ENCOUNTER — Ambulatory Visit: Payer: Self-pay | Admitting: Internal Medicine

## 2016-07-14 MED FILL — IBUPROFEN 600 MG TABLET: 600 | 10 days supply | Qty: 30 | Fill #2

## 2016-07-27 ENCOUNTER — Telehealth: Payer: Self-pay | Admitting: Internal Medicine

## 2016-07-27 DIAGNOSIS — G8929 Other chronic pain: Secondary | ICD-10-CM

## 2016-07-27 DIAGNOSIS — M545 Low back pain: Principal | ICD-10-CM

## 2016-07-27 MED ORDER — TRAMADOL HCL 50 MG PO TABS: 50.0000 mg | ORAL_TABLET | Freq: Two times a day (BID) | ORAL | 0 refills | Status: DC | PRN

## 2016-07-27 NOTE — Telephone Encounter (Signed)
Patient called the office to request medication refill for traMADol (ULTRAM) 50 MG tablet. ° °Thank you.  °

## 2016-07-27 NOTE — Telephone Encounter (Signed)
Please call to pick up

## 2016-07-28 MED FILL — traMADol HCL 50 MG TABS: 50 | 30 days supply | Qty: 60 | Fill #0

## 2016-08-24 MED FILL — ?CYCLOBENZAPRINE 10 MG TABL: 10 | 30 days supply | Qty: 90 | Fill #1

## 2016-08-24 MED FILL — ?OMEPRAZOLE DR 20 MG CAPSUL: 20 | 30 days supply | Qty: 30 | Fill #2

## 2016-08-25 ENCOUNTER — Telehealth: Payer: Self-pay | Admitting: Internal Medicine

## 2016-08-25 DIAGNOSIS — M545 Low back pain: Principal | ICD-10-CM

## 2016-08-25 DIAGNOSIS — J449 Chronic obstructive pulmonary disease, unspecified: Secondary | ICD-10-CM

## 2016-08-25 DIAGNOSIS — G8929 Other chronic pain: Secondary | ICD-10-CM

## 2016-08-25 NOTE — Telephone Encounter (Signed)
Patient called requesting refill of Tramadol. Please follow up.

## 2016-08-26 MED ORDER — TRAMADOL HCL 50 MG PO TABS: 50.0000 mg | ORAL_TABLET | Freq: Two times a day (BID) | ORAL | 0 refills | Status: AC | PRN

## 2016-08-26 NOTE — Telephone Encounter (Signed)
Please have pt do urine drug screen prior to picking rx. thanks

## 2016-08-27 ENCOUNTER — Other Ambulatory Visit: Payer: Self-pay

## 2016-08-27 ENCOUNTER — Other Ambulatory Visit: Payer: Self-pay | Admitting: Family Medicine

## 2016-08-27 DIAGNOSIS — G8929 Other chronic pain: Secondary | ICD-10-CM | POA: Insufficient documentation

## 2016-08-27 DIAGNOSIS — M545 Low back pain: Secondary | ICD-10-CM | POA: Insufficient documentation

## 2016-08-27 MED ORDER — ALBUTEROL SULFATE HFA 108 (90 BASE) MCG/ACT IN AERS: 2.0000 | INHALATION_SPRAY | RESPIRATORY_TRACT | 2 refills | Status: AC | PRN

## 2016-08-27 MED ORDER — FLUTICASONE-SALMETEROL 250-50 MCG/DOSE IN AEPB: 1.0000 | INHALATION_SPRAY | Freq: Two times a day (BID) | RESPIRATORY_TRACT | 0 refills | Status: AC

## 2016-08-27 MED FILL — traMADol HCL 50 MG TABS: 50 | 30 days supply | Qty: 60 | Fill #0

## 2016-08-27 NOTE — Progress Notes (Signed)
Patient here for lab visit only 

## 2016-08-27 NOTE — Progress Notes (Signed)
Patient of Dr Julien NordmannLangeland needing drug screen prior to obtaining Tramadol rx,

## 2016-08-27 NOTE — Telephone Encounter (Signed)
Pt needing a refill of Albuterol  States he has a short term and long term inhaler

## 2016-08-27 NOTE — Telephone Encounter (Signed)
Pt is aware of rx and will pick up today.  

## 2016-09-01 LAB — DRUG SCREEN 10 W/CONF, SERUM
